# Patient Record
Sex: Male | Born: 1965 | Race: White | Hispanic: No | Marital: Single | Smoking: Former smoker
Health system: Southern US, Community
[De-identification: ages and names within clinical notes are randomized; demographics above are authoritative.]

## PROBLEM LIST (undated history)

## (undated) DIAGNOSIS — M1712 Unilateral primary osteoarthritis, left knee: Secondary | ICD-10-CM

## (undated) DIAGNOSIS — M1990 Unspecified osteoarthritis, unspecified site: Secondary | ICD-10-CM

## (undated) DIAGNOSIS — K589 Irritable bowel syndrome without diarrhea: Secondary | ICD-10-CM

## (undated) DIAGNOSIS — E785 Hyperlipidemia, unspecified: Secondary | ICD-10-CM

## (undated) DIAGNOSIS — K219 Gastro-esophageal reflux disease without esophagitis: Secondary | ICD-10-CM

## (undated) DIAGNOSIS — I10 Essential (primary) hypertension: Secondary | ICD-10-CM

## (undated) HISTORY — PX: KNEE ARTHROSCOPY: SHX127

---

## 2006-11-30 ENCOUNTER — Encounter (INDEPENDENT_AMBULATORY_CARE_PROVIDER_SITE_OTHER): Payer: Self-pay | Admitting: Family Medicine

## 2006-12-28 ENCOUNTER — Ambulatory Visit: Payer: Self-pay | Admitting: Family Medicine

## 2006-12-28 DIAGNOSIS — M129 Arthropathy, unspecified: Secondary | ICD-10-CM | POA: Insufficient documentation

## 2006-12-28 DIAGNOSIS — K589 Irritable bowel syndrome without diarrhea: Secondary | ICD-10-CM | POA: Insufficient documentation

## 2006-12-28 DIAGNOSIS — J309 Allergic rhinitis, unspecified: Secondary | ICD-10-CM | POA: Insufficient documentation

## 2006-12-28 DIAGNOSIS — K219 Gastro-esophageal reflux disease without esophagitis: Secondary | ICD-10-CM | POA: Insufficient documentation

## 2006-12-31 ENCOUNTER — Encounter (INDEPENDENT_AMBULATORY_CARE_PROVIDER_SITE_OTHER): Payer: Self-pay | Admitting: Family Medicine

## 2007-01-04 ENCOUNTER — Encounter (INDEPENDENT_AMBULATORY_CARE_PROVIDER_SITE_OTHER): Payer: Self-pay | Admitting: Family Medicine

## 2007-01-04 ENCOUNTER — Telehealth (INDEPENDENT_AMBULATORY_CARE_PROVIDER_SITE_OTHER): Payer: Self-pay | Admitting: Family Medicine

## 2007-01-05 ENCOUNTER — Ambulatory Visit: Payer: Self-pay | Admitting: Cardiovascular Disease

## 2007-01-06 ENCOUNTER — Ambulatory Visit: Payer: Self-pay | Admitting: Family Medicine

## 2007-01-08 ENCOUNTER — Ambulatory Visit (HOSPITAL_COMMUNITY): Admission: RE | Admit: 2007-01-08 | Discharge: 2007-01-08 | Payer: Self-pay | Admitting: Cardiovascular Disease

## 2007-01-11 LAB — CONVERTED CEMR LAB
ALT: 29 units/L (ref 0–53)
AST: 19 units/L (ref 0–37)
Alkaline Phosphatase: 78 units/L (ref 39–117)
Basophils Absolute: 0 10*3/uL (ref 0.0–0.1)
Basophils Relative: 0 % (ref 0–1)
CO2: 29 meq/L (ref 19–32)
Cholesterol: 132 mg/dL (ref 0–200)
Creatinine, Ser: 0.9 mg/dL (ref 0.40–1.50)
Eosinophils Relative: 3 % (ref 0–5)
HCT: 46 % (ref 39.0–52.0)
LDL Cholesterol: 62 mg/dL (ref 0–99)
Lymphocytes Relative: 21 % (ref 12–46)
MCHC: 32.8 g/dL (ref 30.0–36.0)
Monocytes Absolute: 0.7 10*3/uL (ref 0.2–0.7)
Platelets: 243 10*3/uL (ref 150–400)
RDW: 13.8 % (ref 11.5–14.0)
Sodium: 143 meq/L (ref 135–145)
TSH: 5.072 microintl units/mL (ref 0.350–5.50)
Total Bilirubin: 0.4 mg/dL (ref 0.3–1.2)
Total CHOL/HDL Ratio: 4.1
Total Protein: 7 g/dL (ref 6.0–8.3)
VLDL: 38 mg/dL (ref 0–40)

## 2007-01-12 ENCOUNTER — Encounter (HOSPITAL_COMMUNITY): Admission: RE | Admit: 2007-01-12 | Discharge: 2007-02-11 | Payer: Self-pay | Admitting: Cardiovascular Disease

## 2007-01-12 ENCOUNTER — Encounter (INDEPENDENT_AMBULATORY_CARE_PROVIDER_SITE_OTHER): Payer: Self-pay | Admitting: Family Medicine

## 2007-01-12 ENCOUNTER — Ambulatory Visit: Payer: Self-pay | Admitting: Cardiovascular Disease

## 2007-01-25 ENCOUNTER — Ambulatory Visit: Payer: Self-pay | Admitting: Family Medicine

## 2007-01-25 DIAGNOSIS — E785 Hyperlipidemia, unspecified: Secondary | ICD-10-CM | POA: Insufficient documentation

## 2007-01-25 DIAGNOSIS — I10 Essential (primary) hypertension: Secondary | ICD-10-CM | POA: Insufficient documentation

## 2007-01-25 LAB — CONVERTED CEMR LAB
Cholesterol, target level: 200 mg/dL
LDL Goal: 130 mg/dL

## 2007-03-31 ENCOUNTER — Ambulatory Visit: Payer: Self-pay | Admitting: Family Medicine

## 2007-11-11 ENCOUNTER — Ambulatory Visit: Payer: Self-pay | Admitting: Family Medicine

## 2008-01-11 ENCOUNTER — Ambulatory Visit: Payer: Self-pay | Admitting: Family Medicine

## 2008-02-10 ENCOUNTER — Telehealth (INDEPENDENT_AMBULATORY_CARE_PROVIDER_SITE_OTHER): Payer: Self-pay | Admitting: *Deleted

## 2008-04-24 ENCOUNTER — Encounter (INDEPENDENT_AMBULATORY_CARE_PROVIDER_SITE_OTHER): Payer: Self-pay | Admitting: Family Medicine

## 2008-06-12 ENCOUNTER — Ambulatory Visit: Payer: Self-pay | Admitting: Family Medicine

## 2008-07-31 ENCOUNTER — Ambulatory Visit: Payer: Self-pay | Admitting: Family Medicine

## 2008-11-14 ENCOUNTER — Encounter (INDEPENDENT_AMBULATORY_CARE_PROVIDER_SITE_OTHER): Payer: Self-pay | Admitting: Family Medicine

## 2008-12-27 ENCOUNTER — Ambulatory Visit: Payer: Self-pay | Admitting: Family Medicine

## 2008-12-28 ENCOUNTER — Telehealth (INDEPENDENT_AMBULATORY_CARE_PROVIDER_SITE_OTHER): Payer: Self-pay | Admitting: *Deleted

## 2008-12-29 ENCOUNTER — Ambulatory Visit: Payer: Self-pay | Admitting: Family Medicine

## 2009-01-03 ENCOUNTER — Ambulatory Visit: Payer: Self-pay | Admitting: Internal Medicine

## 2009-01-03 ENCOUNTER — Encounter (INDEPENDENT_AMBULATORY_CARE_PROVIDER_SITE_OTHER): Payer: Self-pay | Admitting: Family Medicine

## 2009-01-11 ENCOUNTER — Ambulatory Visit: Payer: Self-pay | Admitting: Family Medicine

## 2009-01-26 ENCOUNTER — Ambulatory Visit: Payer: Self-pay | Admitting: Family Medicine

## 2009-01-26 ENCOUNTER — Ambulatory Visit (HOSPITAL_COMMUNITY): Admission: RE | Admit: 2009-01-26 | Discharge: 2009-01-26 | Payer: Self-pay | Admitting: Family Medicine

## 2009-01-26 DIAGNOSIS — L255 Unspecified contact dermatitis due to plants, except food: Secondary | ICD-10-CM | POA: Insufficient documentation

## 2009-01-26 DIAGNOSIS — R51 Headache: Secondary | ICD-10-CM | POA: Insufficient documentation

## 2009-01-26 DIAGNOSIS — R519 Headache, unspecified: Secondary | ICD-10-CM | POA: Insufficient documentation

## 2009-02-02 ENCOUNTER — Ambulatory Visit: Payer: Self-pay | Admitting: Family Medicine

## 2009-03-19 ENCOUNTER — Ambulatory Visit: Payer: Self-pay | Admitting: Family Medicine

## 2009-03-27 ENCOUNTER — Encounter (INDEPENDENT_AMBULATORY_CARE_PROVIDER_SITE_OTHER): Payer: Self-pay | Admitting: *Deleted

## 2009-03-27 LAB — CONVERTED CEMR LAB
ALT: 30 units/L (ref 0–53)
Alkaline Phosphatase: 77 units/L (ref 39–117)
Basophils Absolute: 0.1 10*3/uL (ref 0.0–0.1)
Basophils Relative: 1 % (ref 0–1)
CO2: 20 meq/L (ref 19–32)
Cholesterol: 136 mg/dL (ref 0–200)
Creatinine, Ser: 0.79 mg/dL (ref 0.40–1.50)
Eosinophils Absolute: 0.2 10*3/uL (ref 0.0–0.7)
LDL Cholesterol: 70 mg/dL (ref 0–99)
MCHC: 35.5 g/dL (ref 30.0–36.0)
MCV: 84.6 fL (ref 78.0–100.0)
Monocytes Relative: 6 % (ref 3–12)
Neutro Abs: 7.5 10*3/uL (ref 1.7–7.7)
Neutrophils Relative %: 75 % (ref 43–77)
Platelets: 257 10*3/uL (ref 150–400)
RBC: 5.33 M/uL (ref 4.22–5.81)
RDW: 13.5 % (ref 11.5–15.5)
Sodium: 138 meq/L (ref 135–145)
Total Bilirubin: 0.5 mg/dL (ref 0.3–1.2)
Total CHOL/HDL Ratio: 4.1
Triglycerides: 166 mg/dL — ABNORMAL HIGH (ref <150)
VLDL: 33 mg/dL (ref 0–40)

## 2010-09-08 HISTORY — PX: MENISCECTOMY: SHX123

## 2010-09-27 ENCOUNTER — Ambulatory Visit
Admission: RE | Admit: 2010-09-27 | Discharge: 2010-09-27 | Payer: Self-pay | Source: Home / Self Care | Attending: Orthopedic Surgery | Admitting: Orthopedic Surgery

## 2010-09-30 LAB — BASIC METABOLIC PANEL
BUN: 11 mg/dL (ref 6–23)
Creatinine, Ser: 0.78 mg/dL (ref 0.4–1.5)
GFR calc non Af Amer: >60 mL/min (ref >60)
Glucose, Bld: 112 mg/dL — ABNORMAL HIGH (ref 70–99)

## 2010-09-30 LAB — POCT HEMOGLOBIN-HEMACUE: Hemoglobin: 14.5 g/dL (ref 13.0–17.0)

## 2010-10-03 NOTE — Op Note (Addendum)
  NAME:  Shawn Glover, Shawn Glover NO.:  [id number]  MEDICAL RECORD NO.:  [id number]          PATIENT TYPE:  AMB  LOCATION:  DSC                          FACILITY:  [facility]  PHYSICIAN:  Eulas Post, MD    DATE OF BIRTH:  03/07/1966  DATE OF PROCEDURE:  09/27/2010 DATE OF DISCHARGE:                              OPERATIVE REPORT   PREOPERATIVE DIAGNOSIS:  Left knee medial meniscus tear.  POSTOPERATIVE DIAGNOSES: 1. 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 2. Grade 2 medial femoral condyle chondromalacia.  OPERATIVE PROCEDURE:  Left knee arthroscopy with partial medial meniscectomy and patellofemoral chondroplasty and chondroplasty of the medial femoral condyle.  ATTENDING SURGEON:  Eulas Post, MD.  ASSISTANT:  Julien Girt, PA-C  OPERATIVE FINDINGS:  The patellofemoral joint had grade 2 chondral changes.  This was primarily on the femoral trochlea.  The medial femoral condyle also had fairly extensive grade 2 changes.  The lateral compartment was completely normal.  The ACL and PCL were intact.  The medial meniscus had a radial tear in the posterior horn that extended back to the capsule.  OPERATIVE PROCEDURE:  The patient was brought to the operating room, placed in supine position.  General anesthesia administered.  Left lower extremity was prepped and draped in usual sterile fashion.  Diagnostic arthroscopy was carried out with the above-named findings.  The arthroscopic shaver and arthroscopic biter was used to debride the medial meniscus back to a stable rim.  The shaver was used to debride the femoral trochlea as well as the medial femoral condyle.  The arthroscopy was completed and irrigated the knee copiously and removed all loose debris, and the instruments were removed and the knee was injected with Marcaine, and I closed the portals with Monocryl followed by Steri-Strips and sterile  gauze.  He was awakened and returned to the PACU in stable and satisfactory condition.  There were no complications. He tolerated the procedure well.  Kerrison Shepperson was present and scrubbed throughout the case and assisted with positioning and exposure, especially given the patient's morbid obesity.  There were no complications.  He tolerated the procedure well.     Eulas Post, MD    JPL/MEDQ  D:  09/27/2010  T:  09/27/2010  Job:  [id number]  Electronically Signed by Teryl Lucy MD on 10/03/2010 05:30:49 PM

## 2011-01-21 NOTE — Procedures (Signed)
NAME:  YOEL, KAUFHOLD NO.:  [id number]   MEDICAL RECORD NO.:  [id number]          PATIENT TYPE:  OUT   LOCATION:  RESP                          FACILITY:  [facility]   PHYSICIAN:  Edward L. Juanetta Gosling, M.D.DATE OF BIRTH:  Aug 16, 1966   DATE OF PROCEDURE:  DATE OF DISCHARGE:                            PULMONARY FUNCTION TEST   PULMONARY FUNCTION TEST:  1. Spirometry is essentially normal with, perhaps, some airflow      obstruction which is mild and most marked in the smaller airways.  2. Total lung capacity and lung volumes are normal.  3. DLCO is normal.  4. Arterial blood gases show mild resting relative hypoxemia.  5. There is significant bronchodilator effect.      Edward L. Juanetta Gosling, M.D.  Electronically Signed     ELH/MEDQ  D:  01/11/2007  T:  01/12/2007  Job:  [id number]   cc:   Noralyn Pick. Eden Emms, MD, [facility]  [address]  [address]  [place]  [place] [address]

## 2011-01-21 NOTE — Procedures (Signed)
NAME:  BRANDT, CHANEY NO.:  [id number]   MEDICAL RECORD NO.:  [id number]          PATIENT TYPE:  REC   LOCATION:  RAD                           FACILITY:  [facility]   PHYSICIAN:  Noralyn Pick. Eden Emms, MD, FACCDATE OF BIRTH:  1965-09-26   DATE OF PROCEDURE:  01/12/2007  DATE OF DISCHARGE:                                ECHOCARDIOGRAM   PROCEDURE:  A 2-D echocardiogram.   INDICATIONS:  Dyspnea.   Left ventricular cavity size was normal.  There was mild to moderate  LVH, ejection fraction 60%.  There are no regional wall motion  abnormalities and no outflow tract gradient.  Mitral valve was mildly  thickened without significant stenosis or regurgitation.  There was mild  left atrial enlargement.  Right-sided cardiac chambers were normal.  There was no evidence of pulmonary hypertension.  Aortic valve was  trileaflet and sclerotic.  There was no stenosis.  Aortic root was  normal.  Subcostal imaging revealed no atrial septal defect, no source  of embolus, no effusion.   M-MODE MEASUREMENTS:  Aortic dimension was 32 mm, left atrial dimension  was 41 mm, septal thickness was 15 mm, LV diastolic dimension 38 mm, LV  systolic dimension 26 mm.   FINAL IMPRESSION:  1. Mild to moderate LVH, ejection fraction 60%.  2. Mild left atrial enlargement.  3. Normal right-sided cardiac chambers with mild TR, no evidence of      pulmonary hypertension.  4. Aortic valve sclerosis.  5. Normal mitral valve.  6. No pericardial effusion.      Noralyn Pick. Eden Emms, MD, [facility]  Electronically Signed     PCN/MEDQ  D:  01/12/2007  T:  01/12/2007  Job:  [id number]

## 2011-01-24 NOTE — Assessment & Plan Note (Signed)
[facility]                       [facility] CARDIOLOGY OFFICE NOTE   CARRON, JAGGI                         MRN:          [id number]  DATE:01/05/2007                            DOB:          11/26/1965    Mr. Shawn Glover is a delightful 45 year old patient referred by Dr.  Erby Pian.  He has been having chest pain and dyspnea.   The patient has no previously documented coronary disease or heart  problems. He has never been tested.   He currently [place] [employer] [place]   He has been in security for a long time, however, he has been more  sedentary, since 2005 he has gained approximately 70 to 80  pounds of  weight. He is a previous smoker quitting in the late 90s. He carries no  diagnosis of asthma, COPD, or emphysema. He does have exertional dyspnea  and there is no associated diaphoresis. On occasion with his dyspnea he  gets a pressure sensation in his chest. There has been no fever or  sputum.  The chest discomfort is not pleuritic   This has been going on for the last 1 to 3 months.   The patient has not had any prolonged episodes. There has been no  associated diaphoresis, syncope, or palpitations.   He has never had a stress test.   The patient's dyspnea is not associated with wheezing. He is currently  not married but does say that people have noted that he snores loudly.   He has never had a formal sleep study.   REVIEW OF SYSTEMS:  Otherwise negative other than indicated in the HPI.   He apparently saw Dr. Erby Pian recently, prior to this he had been going  to urgent cares. He is currently not taking any medications but there is  a question of borderline hypertension. His cardiac risk factors include  this borderline hypertension. He is not a diabetic. He is currently not  smoking. There is no family history of coronary disease.   He has not had any previous surgery or hospitalizations. He has no known  allergies. Patient  is divorced. He has 2 children that live in  [place]. 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 We had a fairly long discussion regarding his eating habits  and lifestyle. He has gotten very sedentary.   FAMILY HISTORY:  Remarkable for mother still being alive and healthy,  dad dying of colon cancer at the age of 102, two brothers and 3 sisters  living and healthy.  Meds:  Non on regular basis   NKDA   His exam is remarkable for a blood pressure of 135/85, pulse is somewhat  elevated at 95 to 98, it is regular.  Thyroid is not palpable.  HEENT: Normal. There is no thyromegaly. There is no carotid bruits.  LUNGS: Clear.  There is an S1, S2 with normal heart sounds.  ABDOMEN: Benign.  LOWER EXTREMITIES:  Intact pulse, no edema.  NEURO: Nonfocal.  SKIN: Warm and dry.   EKG: Totally normal with somewhat elevated heart rate of 100.   IMPRESSION:  Exertional dyspnea in a 45 year old gentleman who has  gained excessive weight.   Clearly part of his exertional dyspnea is related to his weight gain and  deconditioning, however, I think he deserves more of a work up.   We will check a 2D echocardiogram to rule out pulmonary hypertension and  assess right ventricular and left ventricular function.   He did not have any significant murmurs and I doubt that he has  significant valvular disease. The patient will also have a follow up  stress Myoview to make sure the dyspnea is not an anginal equivalent  particularly since there is associated chest pressure.   He will also have some baseline PFTs.   We will see if Dr. Erby Pian has checked any lab work, it would be  reasonable given his tachycardia and dyspnea to possibly check a  hemoglobin, and hematocrit, as well as TSH, and T4. The patient needs to  see a  nutritionist and also needs to be more active. However, to clear  him for his activity and workout schedule, I think it is reasonable to  proceed with his stress test as indicated.     Noralyn Pick. Eden Emms, MD, [facility]  Electronically Signed    PCN/MedQ  DD: 01/05/2007  DT: 01/05/2007  Job #: [id number]

## 2015-12-10 DIAGNOSIS — R7301 Impaired fasting glucose: Secondary | ICD-10-CM | POA: Diagnosis not present

## 2015-12-10 DIAGNOSIS — E782 Mixed hyperlipidemia: Secondary | ICD-10-CM | POA: Diagnosis not present

## 2015-12-12 DIAGNOSIS — I10 Essential (primary) hypertension: Secondary | ICD-10-CM | POA: Diagnosis not present

## 2015-12-12 DIAGNOSIS — E782 Mixed hyperlipidemia: Secondary | ICD-10-CM | POA: Diagnosis not present

## 2016-06-13 DIAGNOSIS — R7301 Impaired fasting glucose: Secondary | ICD-10-CM | POA: Diagnosis not present

## 2016-06-13 DIAGNOSIS — E785 Hyperlipidemia, unspecified: Secondary | ICD-10-CM | POA: Diagnosis not present

## 2016-06-18 DIAGNOSIS — E785 Hyperlipidemia, unspecified: Secondary | ICD-10-CM | POA: Diagnosis not present

## 2016-06-18 DIAGNOSIS — I10 Essential (primary) hypertension: Secondary | ICD-10-CM | POA: Diagnosis not present

## 2016-06-18 DIAGNOSIS — E669 Obesity, unspecified: Secondary | ICD-10-CM | POA: Diagnosis not present

## 2016-06-18 DIAGNOSIS — M1712 Unilateral primary osteoarthritis, left knee: Secondary | ICD-10-CM | POA: Diagnosis not present

## 2016-06-18 DIAGNOSIS — R7301 Impaired fasting glucose: Secondary | ICD-10-CM | POA: Diagnosis not present

## 2016-07-02 ENCOUNTER — Other Ambulatory Visit: Payer: Self-pay | Admitting: Orthopedic Surgery

## 2016-07-16 ENCOUNTER — Encounter (HOSPITAL_BASED_OUTPATIENT_CLINIC_OR_DEPARTMENT_OTHER): Payer: Self-pay | Admitting: *Deleted

## 2016-07-17 ENCOUNTER — Encounter (HOSPITAL_BASED_OUTPATIENT_CLINIC_OR_DEPARTMENT_OTHER)
Admission: RE | Admit: 2016-07-17 | Discharge: 2016-07-17 | Disposition: A | Discharge status: Home / Self Care | Payer: Federal, State, Local not specified - PPO | Source: Ambulatory Visit | Attending: Orthopedic Surgery | Admitting: Orthopedic Surgery

## 2016-07-17 ENCOUNTER — Other Ambulatory Visit: Payer: Self-pay

## 2016-07-17 DIAGNOSIS — Z01818 Encounter for other preprocedural examination: Secondary | ICD-10-CM | POA: Diagnosis not present

## 2016-07-17 LAB — BASIC METABOLIC PANEL
Anion gap: 10 (ref 5–15)
BUN: 6 mg/dL (ref 6–20)
CHLORIDE: 102 mmol/L (ref 101–111)
CO2: 26 mmol/L (ref 22–32)
Calcium: 9.4 mg/dL (ref 8.9–10.3)
Creatinine, Ser: 0.76 mg/dL (ref 0.61–1.24)
GFR calc Af Amer: >60 mL/min (ref >60)
GFR calc non Af Amer: >60 mL/min (ref >60)
Glucose, Bld: 136 mg/dL — ABNORMAL HIGH (ref 65–99)
POTASSIUM: 3.9 mmol/L (ref 3.5–5.1)
SODIUM: 138 mmol/L (ref 135–145)

## 2016-07-17 LAB — SURGICAL PCR SCREEN
MRSA, PCR: NEGATIVE
Staphylococcus aureus: NEGATIVE

## 2016-07-24 ENCOUNTER — Ambulatory Visit (HOSPITAL_COMMUNITY): Payer: Federal, State, Local not specified - PPO

## 2016-07-24 ENCOUNTER — Encounter (HOSPITAL_BASED_OUTPATIENT_CLINIC_OR_DEPARTMENT_OTHER)
Admission: RE | Disposition: A | Discharge status: Home / Self Care | Payer: Self-pay | Source: Ambulatory Visit | Attending: Orthopedic Surgery

## 2016-07-24 ENCOUNTER — Ambulatory Visit (HOSPITAL_BASED_OUTPATIENT_CLINIC_OR_DEPARTMENT_OTHER): Payer: Federal, State, Local not specified - PPO | Admitting: Anesthesiology

## 2016-07-24 ENCOUNTER — Ambulatory Visit (HOSPITAL_BASED_OUTPATIENT_CLINIC_OR_DEPARTMENT_OTHER)
Admission: RE | Admit: 2016-07-24 | Discharge: 2016-07-25 | Disposition: A | Discharge status: Home / Self Care | Payer: Federal, State, Local not specified - PPO | Source: Ambulatory Visit | Attending: Orthopedic Surgery | Admitting: Orthopedic Surgery

## 2016-07-24 ENCOUNTER — Encounter (HOSPITAL_BASED_OUTPATIENT_CLINIC_OR_DEPARTMENT_OTHER): Payer: Self-pay

## 2016-07-24 DIAGNOSIS — Z96652 Presence of left artificial knee joint: Secondary | ICD-10-CM | POA: Diagnosis not present

## 2016-07-24 DIAGNOSIS — M1712 Unilateral primary osteoarthritis, left knee: Secondary | ICD-10-CM | POA: Diagnosis not present

## 2016-07-24 DIAGNOSIS — Z87891 Personal history of nicotine dependence: Secondary | ICD-10-CM | POA: Diagnosis not present

## 2016-07-24 DIAGNOSIS — K219 Gastro-esophageal reflux disease without esophagitis: Secondary | ICD-10-CM | POA: Diagnosis not present

## 2016-07-24 DIAGNOSIS — E785 Hyperlipidemia, unspecified: Secondary | ICD-10-CM | POA: Insufficient documentation

## 2016-07-24 DIAGNOSIS — T888 Other specified complications of surgical and medical care, not elsewhere classified: Secondary | ICD-10-CM | POA: Diagnosis not present

## 2016-07-24 DIAGNOSIS — K589 Irritable bowel syndrome without diarrhea: Secondary | ICD-10-CM | POA: Diagnosis not present

## 2016-07-24 DIAGNOSIS — I10 Essential (primary) hypertension: Secondary | ICD-10-CM | POA: Diagnosis not present

## 2016-07-24 DIAGNOSIS — Z471 Aftercare following joint replacement surgery: Secondary | ICD-10-CM | POA: Diagnosis not present

## 2016-07-24 DIAGNOSIS — G8918 Other acute postprocedural pain: Secondary | ICD-10-CM | POA: Diagnosis not present

## 2016-07-24 HISTORY — DX: Unilateral primary osteoarthritis, left knee: M17.12

## 2016-07-24 HISTORY — PX: PARTIAL KNEE ARTHROPLASTY: SHX2174

## 2016-07-24 HISTORY — DX: Gastro-esophageal reflux disease without esophagitis: K21.9

## 2016-07-24 HISTORY — DX: Essential (primary) hypertension: I10

## 2016-07-24 HISTORY — DX: Hyperlipidemia, unspecified: E78.5

## 2016-07-24 HISTORY — DX: Irritable bowel syndrome, unspecified: K58.9

## 2016-07-24 HISTORY — DX: Unspecified osteoarthritis, unspecified site: M19.90

## 2016-07-24 SURGERY — ARTHROPLASTY, KNEE, UNICOMPARTMENTAL
Anesthesia: General | Site: Knee | Laterality: Left

## 2016-07-24 MED ORDER — CEFAZOLIN SODIUM-DEXTROSE 2-4 GM/100ML-% IV SOLN
INTRAVENOUS | Status: AC
  Filled 2016-07-24: qty 100

## 2016-07-24 MED ORDER — AMLODIPINE BESYLATE 10 MG PO TABS: 10.0000 mg | ORAL_TABLET | Freq: Every day | ORAL | Status: DC

## 2016-07-24 MED ORDER — HYDROMORPHONE HCL 1 MG/ML IJ SOLN
0.2500 mg | INTRAMUSCULAR | Status: DC | PRN
  Administered 2016-07-24 (×2): 0.5 mg via INTRAVENOUS

## 2016-07-24 MED ORDER — METOPROLOL TARTRATE 5 MG/5ML IV SOLN
INTRAVENOUS | Status: DC | PRN
  Administered 2016-07-24: 2.5 mg via INTRAVENOUS

## 2016-07-24 MED ORDER — DOCUSATE SODIUM 100 MG PO CAPS: 100.0000 mg | ORAL_CAPSULE | Freq: Two times a day (BID) | ORAL | Status: DC

## 2016-07-24 MED ORDER — HYDROMORPHONE HCL 1 MG/ML IJ SOLN: 0.5000 mg | INTRAMUSCULAR | Status: DC | PRN

## 2016-07-24 MED ORDER — SODIUM CHLORIDE 0.9 % IV SOLN
INTRAVENOUS | Status: DC
  Administered 2016-07-24: 12:00:00 via INTRAVENOUS

## 2016-07-24 MED ORDER — FENTANYL CITRATE (PF) 100 MCG/2ML IJ SOLN
INTRAMUSCULAR | Status: AC
  Filled 2016-07-24: qty 2

## 2016-07-24 MED ORDER — SENNA 8.6 MG PO TABS: 1.0000 | ORAL_TABLET | Freq: Two times a day (BID) | ORAL | Status: DC

## 2016-07-24 MED ORDER — SCOPOLAMINE 1 MG/3DAYS TD PT72: 1.0000 | MEDICATED_PATCH | Freq: Once | TRANSDERMAL | Status: DC | PRN

## 2016-07-24 MED ORDER — CEFAZOLIN SODIUM-DEXTROSE 2-4 GM/100ML-% IV SOLN
2.0000 g | INTRAVENOUS | Status: AC
  Administered 2016-07-24: 2 g via INTRAVENOUS

## 2016-07-24 MED ORDER — LACTATED RINGERS IV SOLN
INTRAVENOUS | Status: DC
  Administered 2016-07-24 (×2): via INTRAVENOUS

## 2016-07-24 MED ORDER — METHOCARBAMOL 1000 MG/10ML IJ SOLN: 500.0000 mg | Freq: Four times a day (QID) | INTRAVENOUS | Status: DC | PRN

## 2016-07-24 MED ORDER — POLYETHYLENE GLYCOL 3350 17 G PO PACK: 17.0000 g | PACK | Freq: Every day | ORAL | Status: DC | PRN

## 2016-07-24 MED ORDER — MIDAZOLAM HCL 2 MG/2ML IJ SOLN
INTRAMUSCULAR | Status: AC
  Filled 2016-07-24: qty 2

## 2016-07-24 MED ORDER — ONDANSETRON HCL 4 MG/2ML IJ SOLN: 4.0000 mg | Freq: Four times a day (QID) | INTRAMUSCULAR | Status: DC | PRN

## 2016-07-24 MED ORDER — PROPOFOL 500 MG/50ML IV EMUL
INTRAVENOUS | Status: AC
  Filled 2016-07-24: qty 50

## 2016-07-24 MED ORDER — HYDROMORPHONE HCL 1 MG/ML IJ SOLN
INTRAMUSCULAR | Status: AC
  Filled 2016-07-24: qty 1

## 2016-07-24 MED ORDER — BUPIVACAINE HCL (PF) 0.25 % IJ SOLN
INTRAMUSCULAR | Status: DC | PRN
  Administered 2016-07-24: 30 mL via INTRA_ARTICULAR

## 2016-07-24 MED ORDER — BISACODYL 10 MG RE SUPP: 10.0000 mg | Freq: Every day | RECTAL | Status: DC | PRN

## 2016-07-24 MED ORDER — OXYCODONE HCL 5 MG PO TABS: 5.0000 mg | ORAL_TABLET | ORAL | Status: DC | PRN

## 2016-07-24 MED ORDER — ONDANSETRON HCL 4 MG PO TABS: 4.0000 mg | ORAL_TABLET | Freq: Three times a day (TID) | ORAL | 0 refills | Status: DC | PRN

## 2016-07-24 MED ORDER — METHOCARBAMOL 500 MG PO TABS
500.0000 mg | ORAL_TABLET | Freq: Four times a day (QID) | ORAL | Status: DC | PRN
  Administered 2016-07-24 – 2016-07-25 (×2): 500 mg via ORAL
  Filled 2016-07-24 (×2): qty 1

## 2016-07-24 MED ORDER — CEFAZOLIN IN D5W 1 GM/50ML IV SOLN
1.0000 g | Freq: Four times a day (QID) | INTRAVENOUS | Status: AC
  Administered 2016-07-24 – 2016-07-25 (×3): 1 g via INTRAVENOUS
  Filled 2016-07-24 (×3): qty 50

## 2016-07-24 MED ORDER — MIDAZOLAM HCL 2 MG/2ML IJ SOLN
1.0000 mg | INTRAMUSCULAR | Status: DC | PRN
  Administered 2016-07-24: 2 mg via INTRAVENOUS

## 2016-07-24 MED ORDER — KETOROLAC TROMETHAMINE 30 MG/ML IJ SOLN
INTRAMUSCULAR | Status: AC
  Filled 2016-07-24: qty 1

## 2016-07-24 MED ORDER — ZOLPIDEM TARTRATE 5 MG PO TABS: 5.0000 mg | ORAL_TABLET | Freq: Every evening | ORAL | Status: DC | PRN

## 2016-07-24 MED ORDER — BUPIVACAINE-EPINEPHRINE (PF) 0.5% -1:200000 IJ SOLN
INTRAMUSCULAR | Status: DC | PRN
  Administered 2016-07-24: 30 mL via PERINEURAL

## 2016-07-24 MED ORDER — BACLOFEN 10 MG PO TABS: 10.0000 mg | ORAL_TABLET | Freq: Three times a day (TID) | ORAL | 0 refills | Status: DC

## 2016-07-24 MED ORDER — DEXAMETHASONE SODIUM PHOSPHATE 4 MG/ML IJ SOLN
INTRAMUSCULAR | Status: DC | PRN
  Administered 2016-07-24: 10 mg via INTRAVENOUS

## 2016-07-24 MED ORDER — LACTATED RINGERS IV SOLN
INTRAVENOUS | Status: DC
Start: 2016-07-24 — End: 2016-07-25

## 2016-07-24 MED ORDER — SODIUM CHLORIDE 0.9 % IV SOLN
INTRAVENOUS | Status: DC | PRN
  Administered 2016-07-24: 1000 mL

## 2016-07-24 MED ORDER — LIDOCAINE 2% (20 MG/ML) 5 ML SYRINGE
INTRAMUSCULAR | Status: AC
  Filled 2016-07-24: qty 5

## 2016-07-24 MED ORDER — ONDANSETRON HCL 4 MG/2ML IJ SOLN
INTRAMUSCULAR | Status: AC
  Filled 2016-07-24: qty 2

## 2016-07-24 MED ORDER — METOCLOPRAMIDE HCL 5 MG PO TABS: 5.0000 mg | ORAL_TABLET | Freq: Three times a day (TID) | ORAL | Status: DC | PRN

## 2016-07-24 MED ORDER — FENTANYL CITRATE (PF) 100 MCG/2ML IJ SOLN
INTRAMUSCULAR | Status: DC | PRN
  Administered 2016-07-24 (×2): 25 ug via INTRAVENOUS
  Administered 2016-07-24: 50 ug via INTRAVENOUS
  Administered 2016-07-24: 100 ug via INTRAVENOUS
  Administered 2016-07-24: 25 ug via INTRAVENOUS
  Administered 2016-07-24: 50 ug via INTRAVENOUS
  Administered 2016-07-24 (×2): 25 ug via INTRAVENOUS

## 2016-07-24 MED ORDER — ONDANSETRON HCL 4 MG PO TABS: 4.0000 mg | ORAL_TABLET | Freq: Four times a day (QID) | ORAL | Status: DC | PRN

## 2016-07-24 MED ORDER — ONDANSETRON HCL 4 MG/2ML IJ SOLN
INTRAMUSCULAR | Status: DC | PRN
  Administered 2016-07-24: 4 mg via INTRAVENOUS

## 2016-07-24 MED ORDER — FENTANYL CITRATE (PF) 100 MCG/2ML IJ SOLN
50.0000 ug | INTRAMUSCULAR | Status: DC | PRN
  Administered 2016-07-24: 100 ug via INTRAVENOUS

## 2016-07-24 MED ORDER — OXYCODONE HCL 5 MG PO TABS: 5.0000 mg | ORAL_TABLET | ORAL | 0 refills | Status: DC | PRN

## 2016-07-24 MED ORDER — SENNA-DOCUSATE SODIUM 8.6-50 MG PO TABS: 2.0000 | ORAL_TABLET | Freq: Every day | ORAL | 1 refills | Status: DC

## 2016-07-24 MED ORDER — METOCLOPRAMIDE HCL 5 MG/ML IJ SOLN
10.0000 mg | Freq: Once | INTRAMUSCULAR | Status: DC | PRN
Start: 2016-07-24 — End: 2016-07-25

## 2016-07-24 MED ORDER — OXYCODONE-ACETAMINOPHEN 5-325 MG PO TABS
1.0000 | ORAL_TABLET | ORAL | Status: DC | PRN
  Administered 2016-07-24: 2 via ORAL
  Administered 2016-07-24: 1 via ORAL
  Administered 2016-07-25: 2 via ORAL
  Filled 2016-07-24: qty 2
  Filled 2016-07-24: qty 1
  Filled 2016-07-24: qty 2

## 2016-07-24 MED ORDER — KETOROLAC TROMETHAMINE 30 MG/ML IJ SOLN
INTRAMUSCULAR | Status: DC | PRN
  Administered 2016-07-24: 30 mg via INTRA_ARTICULAR

## 2016-07-24 MED ORDER — METOCLOPRAMIDE HCL 5 MG/ML IJ SOLN: 5.0000 mg | Freq: Three times a day (TID) | INTRAMUSCULAR | Status: DC | PRN

## 2016-07-24 MED ORDER — MEPERIDINE HCL 25 MG/ML IJ SOLN
6.2500 mg | INTRAMUSCULAR | Status: DC | PRN
Start: 2016-07-24 — End: 2016-07-25

## 2016-07-24 MED ORDER — RIVAROXABAN 10 MG PO TABS: 10.0000 mg | ORAL_TABLET | Freq: Every day | ORAL | 0 refills | Status: DC

## 2016-07-24 MED ORDER — MIDAZOLAM HCL 5 MG/5ML IJ SOLN
INTRAMUSCULAR | Status: DC | PRN
  Administered 2016-07-24: 2 mg via INTRAVENOUS

## 2016-07-24 MED ORDER — LIDOCAINE HCL (CARDIAC) 20 MG/ML IV SOLN
INTRAVENOUS | Status: DC | PRN
  Administered 2016-07-24: 50 mg via INTRAVENOUS

## 2016-07-24 MED ORDER — METOPROLOL TARTRATE 5 MG/5ML IV SOLN
INTRAVENOUS | Status: AC
  Filled 2016-07-24: qty 5

## 2016-07-24 MED ORDER — MAGNESIUM CITRATE PO SOLN: 1.0000 | Freq: Once | ORAL | Status: DC | PRN

## 2016-07-24 MED ORDER — PROPOFOL 10 MG/ML IV BOLUS
INTRAVENOUS | Status: DC | PRN
  Administered 2016-07-24: 50 mg via INTRAVENOUS
  Administered 2016-07-24: 200 mg via INTRAVENOUS

## 2016-07-24 SURGICAL SUPPLY — 67 items
BANDAGE ACE 6X5 VEL STRL LF (GAUZE/BANDAGES/DRESSINGS) ×2 IMPLANT
BANDAGE ESMARK 6X9 LF (GAUZE/BANDAGES/DRESSINGS) ×1 IMPLANT
BLADE SURG 10 STRL SS (BLADE) ×2 IMPLANT
BLADE SURG 15 STRL LF DISP TIS (BLADE) ×2 IMPLANT
BLADE SURG 15 STRL SS (BLADE) ×4
BNDG CMPR 9X6 STRL LF SNTH (GAUZE/BANDAGES/DRESSINGS) ×1
BNDG ESMARK 6X9 LF (GAUZE/BANDAGES/DRESSINGS) ×2
BOWL SMART MIX CTS (DISPOSABLE) ×2 IMPLANT
CANISTER SUCT 1200ML W/VALVE (MISCELLANEOUS) IMPLANT
CANISTER SUCTION 2500CC (MISCELLANEOUS) ×1 IMPLANT
CAPT KNEE PARTIAL 2 ×1 IMPLANT
CEMENT HV SMART SET (Cement) ×2 IMPLANT
CLSR STERI-STRIP ANTIMIC 1/2X4 (GAUZE/BANDAGES/DRESSINGS) ×3 IMPLANT
COVER BACK TABLE 60X90IN (DRAPES) ×2 IMPLANT
CUFF TOURNIQUET SINGLE 34IN LL (TOURNIQUET CUFF) IMPLANT
CUFF TOURNIQUET SINGLE 44IN (TOURNIQUET CUFF) ×1 IMPLANT
DECANTER SPIKE VIAL GLASS SM (MISCELLANEOUS) IMPLANT
DRAPE EXTREMITY T 121X128X90 (DRAPE) ×2 IMPLANT
DRAPE IMP U-DRAPE 54X76 (DRAPES) ×2 IMPLANT
DRAPE U-SHAPE 47X51 STRL (DRAPES) ×2 IMPLANT
DRSG MEPILEX BORDER 4X8 (GAUZE/BANDAGES/DRESSINGS) ×1 IMPLANT
DRSG PAD ABDOMINAL 8X10 ST (GAUZE/BANDAGES/DRESSINGS) ×1 IMPLANT
DURAPREP 26ML APPLICATOR (WOUND CARE) ×3 IMPLANT
ELECT REM PT RETURN 9FT ADLT (ELECTROSURGICAL) ×2
ELECTRODE REM PT RTRN 9FT ADLT (ELECTROSURGICAL) ×1 IMPLANT
FACESHIELD WRAPAROUND (MASK) ×4 IMPLANT
FACESHIELD WRAPAROUND OR TEAM (MASK) ×2 IMPLANT
GAUZE SPONGE 4X4 12PLY STRL (GAUZE/BANDAGES/DRESSINGS) ×1 IMPLANT
GLOVE BIO SURGEON STRL SZ8 (GLOVE) ×2 IMPLANT
GLOVE BIOGEL PI IND STRL 7.0 (GLOVE) IMPLANT
GLOVE BIOGEL PI IND STRL 8 (GLOVE) ×2 IMPLANT
GLOVE BIOGEL PI INDICATOR 7.0 (GLOVE) ×2
GLOVE BIOGEL PI INDICATOR 8 (GLOVE) ×2
GLOVE ECLIPSE 6.5 STRL STRAW (GLOVE) ×1 IMPLANT
GLOVE ORTHO TXT STRL SZ7.5 (GLOVE) ×2 IMPLANT
GOWN STRL REUS W/ TWL LRG LVL3 (GOWN DISPOSABLE) ×1 IMPLANT
GOWN STRL REUS W/ TWL XL LVL3 (GOWN DISPOSABLE) ×2 IMPLANT
GOWN STRL REUS W/TWL LRG LVL3 (GOWN DISPOSABLE) ×2
GOWN STRL REUS W/TWL XL LVL3 (GOWN DISPOSABLE) ×4
HANDPIECE INTERPULSE COAX TIP (DISPOSABLE) ×2
IMMOBILIZER KNEE 22 UNIV (SOFTGOODS) ×2 IMPLANT
IMMOBILIZER KNEE 24 THIGH 36 (MISCELLANEOUS) ×1 IMPLANT
IMMOBILIZER KNEE 24 UNIV (MISCELLANEOUS)
MANIFOLD NEPTUNE II (INSTRUMENTS) ×2 IMPLANT
NS IRRIG 1000ML POUR BTL (IV SOLUTION) ×2 IMPLANT
PACK ARTHROSCOPY DSU (CUSTOM PROCEDURE TRAY) ×2 IMPLANT
PACK BASIN DAY SURGERY FS (CUSTOM PROCEDURE TRAY) ×2 IMPLANT
PACK BLADE SAW RECIP 70 3 PT (BLADE) ×2 IMPLANT
PENCIL BUTTON HOLSTER BLD 10FT (ELECTRODE) ×2 IMPLANT
SET HNDPC FAN SPRY TIP SCT (DISPOSABLE) ×1 IMPLANT
SHEET MEDIUM DRAPE 40X70 STRL (DRAPES) ×2 IMPLANT
SLEEVE SCD COMPRESS KNEE MED (MISCELLANEOUS) ×2 IMPLANT
SPONGE LAP 18X18 X RAY DECT (DISPOSABLE) ×2 IMPLANT
SUCTION FRAZIER HANDLE 10FR (MISCELLANEOUS) ×1
SUCTION TUBE FRAZIER 10FR DISP (MISCELLANEOUS) ×1 IMPLANT
SUT MNCRL AB 4-0 PS2 18 (SUTURE) IMPLANT
SUT VIC AB 0 CT1 27 (SUTURE) ×2
SUT VIC AB 0 CT1 27XBRD ANBCTR (SUTURE) ×1 IMPLANT
SUT VIC AB 2-0 SH 27 (SUTURE) ×2
SUT VIC AB 2-0 SH 27XBRD (SUTURE) ×1 IMPLANT
SUT VICRYL 3-0 CR8 SH (SUTURE) ×2 IMPLANT
SUT VICRYL 4-0 PS2 18IN ABS (SUTURE) IMPLANT
SYR BULB IRRIGATION 50ML (SYRINGE) ×2 IMPLANT
TOWEL OR 17X24 6PK STRL BLUE (TOWEL DISPOSABLE) ×2 IMPLANT
TOWEL OR NON WOVEN STRL DISP B (DISPOSABLE) ×4 IMPLANT
TUBE CONNECTING 20X1/4 (TUBING) IMPLANT
YANKAUER SUCT BULB TIP NO VENT (SUCTIONS) ×2 IMPLANT

## 2016-07-24 NOTE — Anesthesia Preprocedure Evaluation (Signed)
Anesthesia Evaluation  Patient identified by MRN, date of birth, ID band Patient awake    Reviewed: Allergy & Precautions, NPO status , Patient's Chart, lab work & pertinent test results  Airway Mallampati: II  TM Distance: >3 FB Neck ROM: Full    Dental no notable dental hx.    Pulmonary neg pulmonary ROS, former smoker,    Pulmonary exam normal breath sounds clear to auscultation       Cardiovascular hypertension, Pt. on medications Normal cardiovascular exam Rhythm:Regular Rate:Normal     Neuro/Psych negative neurological ROS  negative psych ROS   GI/Hepatic negative GI ROS, Neg liver ROS,   Endo/Other  negative endocrine ROS  Renal/GU negative Renal ROS  negative genitourinary   Musculoskeletal negative musculoskeletal ROS (+)   Abdominal   Peds negative pediatric ROS (+)  Hematology negative hematology ROS (+)   Anesthesia Other Findings   Reproductive/Obstetrics negative OB ROS                             Anesthesia Physical Anesthesia Plan  ASA: II  Anesthesia Plan: General   Post-op Pain Management: GA combined w/ Regional for post-op pain   Induction: Intravenous  Airway Management Planned: LMA and Oral ETT  Additional Equipment:   Intra-op Plan:   Post-operative Plan: Extubation in OR  Informed Consent: I have reviewed the patients History and Physical, chart, labs and discussed the procedure including the risks, benefits and alternatives for the proposed anesthesia with the patient or authorized representative who has indicated his/her understanding and acceptance.   Dental advisory given  Plan Discussed with: CRNA  Anesthesia Plan Comments: (Adductor canal block)        Anesthesia Quick Evaluation

## 2016-07-24 NOTE — Op Note (Signed)
07/24/2016  9:40 AM  PATIENT:  Shawn Glover    PRE-OPERATIVE DIAGNOSIS:  Left Knee DJD  POST-OPERATIVE DIAGNOSIS:  Same  PROCEDURE:  LEFT UNICOMPARTMENTAL KNEE  SURGEON:  Eulas PostLANDAU,Melysa Schroyer P, MD  PHYSICIAN ASSISTANT: Janace LittenBrandon Parry, OPA-C, present and scrubbed throughout the case, critical for completion in a timely fashion, and for retraction, instrumentation, and closure.  ANESTHESIA:   General, with abductor canal block, and intra-articular injection of 30 mL of Marcaine and 30 mg of Toradol  Estimated blood loss: 75 mL  PREOPERATIVE INDICATIONS:  Shawn Glover is a  50 y.o. male with a diagnosis of Left Knee DJD who failed conservative measures and elected for surgical management.    The risks benefits and alternatives were discussed with the patient preoperatively including but not limited to the risks of infection, bleeding, nerve injury, cardiopulmonary complications, blood clots, the need for revision surgery, among others, and the patient was willing to proceed.  OPERATIVE IMPLANTS: Biomet Oxford mobile bearing medial compartment arthroplasty femur size large, tibia size D, bearing size 3.  OPERATIVE FINDINGS: Endstage grade 4 medial compartment osteoarthritis with significant hypertrophic osteophyte formation on the patella as well as along the medial gutter and along the suprapatellar ridge. The femoral trochlea had a small flap tear, with at least a grade 2 changes centrally. The lateral compartment was overall intact, the anterior cruciate ligament and PCL were intact. The medial compartment had scalloping of the tibia.  Unique aspects of the case: Access was very tight, I recut the tibia with a neutral shim after cutting it with the +2 shim. This barely gave enough space for a size 3. The MCL had significant contracture, and substantial varus chronic malalignment. The knee was somewhat tighter in flexion than extension, but ultimately I was able to balance the gaps. The  intramedullary guide did not have a lot of resistance, the ankle looked appropriate, but it slid in without much of a feel, as if there was a capacious intramedullary femoral canal.  OPERATIVE PROCEDURE: The patient was brought to the operating room placed in supine position. General anesthesia was administered. IV antibiotics were given. The lower extremity was placed in the legholder and prepped and draped in usual sterile fashion.  Time out was performed.  The leg was elevated and exsanguinated and the tourniquet was inflated. Anteromedial incision was performed, and I took care to preserve the MCL. Parapatellar incision was carried out, and the osteophytes were excised, along with the medial meniscus and a small portion of the fat pad.  The extra medullary tibial cutting jig was applied, using the spoon and the 4mm G-Clamp and the 2 mm shim, and I took care to protect the anterior cruciate ligament insertion and the tibial spine. The medial collateral ligament was also protected, and I resected my proximal tibia, matching the anatomic slope.   The proximal tibial bony cut was removed in one piece, and  measured my gap, which did not measure 4 mm, so I went back and took a second cut using the neutral shim. I then had a 4 mm gap, andI turned my attention to the femur.  The intramedullary femoral rod was placed using the drill, and then using the appropriate reference, I assembled the femoral jig, setting my posterior cutting block. I resected my posterior femur, used the 0 spigot for the anterior femur, and then measured my gap.   I then used the appropriate mill to match the extension gap to the flexion gap. The second  milling was at a 3.  The gaps were then measured again with the appropriate feeler gauges. Once I had balanced flexion and extension gaps, I then completed the preparation of the femur.  I milled off the anterior aspect of the distal femur to prevent impingement. I also exposed the  tibia, and selected the above-named component, and then used the cutting jig to prepare the keel slot on the tibia. I also used the awl to curette out the bone to complete the preparation of the keel. The back wall was intact.  I then placed trial components, and it was found to have excellent motion, and appropriate balance.  I then cemented the components into place, cementing the tibia first, removing all excess cement, and then cementing the femur.  All loose cement was removed.  The real polyethylene insert was applied manually, and the knee was taken through functional range of motion, and found to have excellent stability and restoration of joint motion, with excellent balance.  The wounds were irrigated copiously, and the parapatellar tissue closed with Vicryl, followed by Vicryl for the subcutaneous tissue, with routine closure with Steri-Strips and sterile gauze.  The tourniquet was released, and the patient was awakened and extubated and returned to PACU in stable and satisfactory condition. There were no complications.

## 2016-07-24 NOTE — Anesthesia Procedure Notes (Addendum)
Anesthesia Regional Block:  Adductor canal block  Pre-Anesthetic Checklist: ,, timeout performed, Correct Patient, Correct Site, Correct Laterality, Correct Procedure, Correct Position, site marked, Risks and benefits discussed,  Surgical consent,  Pre-op evaluation,  At surgeon's request and post-op pain management  Laterality: Left and Lower  Prep: Maximum Sterile Barrier Precautions used, chloraprep       Needles:  Injection technique: Single-shot  Needle Type: Echogenic Stimulator Needle     Needle Length: 10cm 10 cm Needle Gauge: 21 G    Additional Needles:  Procedures: ultrasound guided (picture in chart) Adductor canal block Narrative:  Injection made incrementally with aspirations every 5 mL.  Performed by: Personally   Additional Notes: Risks, benefits and alternative to block explained extensively.  Patient tolerated procedure well, without complications.

## 2016-07-24 NOTE — H&P (Signed)
PREOPERATIVE H&P  Chief Complaint: Left Knee DJD  HPI: Shawn Glover is a 50 y.o. male who presents for preoperative history and physical with a diagnosis of Left Knee DJD. Symptoms are rated as moderate to severe, and have been worsening.  This is significantly impairing activities of daily living.  He has elected for surgical management.   He has failed injections, activity modification, anti-inflammatories, previous knee arthroscopy, and assistive devices.  Preoperative X-rays demonstrate end stage degenerative changes with osteophyte formation, loss of joint space, subchondral sclerosis.   Past Medical History:  Diagnosis Date  . Arthritis   . GERD (gastroesophageal reflux disease)   . Hyperlipemia   . Hypertension   . IBS (irritable bowel syndrome)    Past Surgical History:  Procedure Laterality Date  . KNEE ARTHROSCOPY    . MENISCECTOMY  2012   Social History   Social History  . Marital status: Single    Spouse name: N/A  . Number of children: N/A  . Years of education: N/A   Social History Main Topics  . Smoking status: Former Games developermoker  . Smokeless tobacco: Never Used  . Alcohol use No  . Drug use: No  . Sexual activity: Not Asked   Other Topics Concern  . None   Social History Narrative  . None   History reviewed. No pertinent family history. Not on File Prior to Admission medications   Medication Sig Start Date End Date Taking? Authorizing Provider  amLODipine (NORVASC) 10 MG tablet Take 10 mg by mouth daily.   Yes Historical Provider, MD     Positive ROS: All other systems have been reviewed and were otherwise negative with the exception of those mentioned in the HPI and as above.  Physical Exam: General: Alert, no acute distress Cardiovascular: No pedal edema Respiratory: No cyanosis, no use of accessory musculature GI: No organomegaly, abdomen is soft and non-tender Skin: No lesions in the area of chief complaint Neurologic: Sensation intact  distally Psychiatric: Patient is competent for consent with normal mood and affect Lymphatic: No axillary or cervical lymphadenopathy  MUSCULOSKELETAL: left knee with varus pseudolaxity and crepitance with painful arc of motion 0-125  Assessment: Left Knee DJD   Plan: Plan for Procedure(s): LEFT UNICOMPARTMENTAL KNEE  The risks benefits and alternatives were discussed with the patient including but not limited to the risks of nonoperative treatment, versus surgical intervention including infection, bleeding, nerve injury,  blood clots, cardiopulmonary complications, morbidity, mortality, among others, and they were willing to proceed.   Eulas PostLANDAU,Andrei Mccook P, MD Cell [phone]   07/24/2016 7:34 AM

## 2016-07-24 NOTE — Transfer of Care (Signed)
Immediate Anesthesia Transfer of Care Note  Patient: Shawn LoftyJohn A Rhett  Procedure(s) Performed: Procedure(s): LEFT UNICOMPARTMENTAL KNEE (Left)  Patient Location: PACU  Anesthesia Type:GA combined with regional for post-op pain  Level of Consciousness: sedated  Airway & Oxygen Therapy: Patient Spontanous Breathing and Patient connected to face mask oxygen  Post-op Assessment: Report given to RN and Post -op Vital signs reviewed and stable  Post vital signs: Reviewed and stable  Last Vitals:  Vitals:   07/24/16 0710 07/24/16 0715  BP:  126/71  Pulse: 68 78  Resp: 12 17  Temp:      Last Pain:  Vitals:   07/24/16 0631  TempSrc: Oral         Complications: No apparent anesthesia complications

## 2016-07-24 NOTE — Anesthesia Procedure Notes (Signed)
Procedure Name: LMA Insertion Date/Time: 07/24/2016 7:42 AM Performed by: Genevieve NorlanderLINKA, Shawn Glover Pre-anesthesia Checklist: Patient identified, Emergency Drugs available, Suction available, Patient being monitored and Timeout performed Patient Re-evaluated:Patient Re-evaluated prior to inductionOxygen Delivery Method: Circle system utilized Preoxygenation: Pre-oxygenation with 100% oxygen Intubation Type: IV induction Ventilation: Mask ventilation without difficulty LMA: LMA inserted LMA Size: 5.0 Number of attempts: 1 Airway Equipment and Method: Bite block Placement Confirmation: positive ETCO2 Tube secured with: Tape Dental Injury: Teeth and Oropharynx as per pre-operative assessment

## 2016-07-24 NOTE — Progress Notes (Signed)
Assisted Dr. Carignan with left, ultrasound guided, femoral block. Side rails up, monitors on throughout procedure. See vital signs in flow sheet. Tolerated Procedure well. 

## 2016-07-24 NOTE — Anesthesia Postprocedure Evaluation (Signed)
Anesthesia Post Note  Patient: Shawn Glover  Procedure(s) Performed: Procedure(s) (LRB): LEFT UNICOMPARTMENTAL KNEE (Left)  Patient location during evaluation: PACU Anesthesia Type: General and Regional Level of consciousness: awake and alert Pain management: pain level controlled Vital Signs Assessment: post-procedure vital signs reviewed and stable Respiratory status: spontaneous breathing, nonlabored ventilation, respiratory function stable and patient connected to nasal cannula oxygen Cardiovascular status: blood pressure returned to baseline and stable Postop Assessment: no signs of nausea or vomiting Anesthetic complications: no    Last Vitals:  Vitals:   07/24/16 1115 07/24/16 1130  BP: 123/80 117/87  Pulse: 88 75  Resp: 16 16  Temp:      Last Pain:  Vitals:   07/24/16 1145  TempSrc:   PainSc: 1                  Phillips Groutarignan, Carlisle Enke

## 2016-07-24 NOTE — Discharge Instructions (Signed)
INSTRUCTIONS AFTER JOINT REPLACEMENT  ° °o Remove items at home which could result in a fall. This includes throw rugs or furniture in walking pathways °o ICE to the affected joint every three hours while awake for 30 minutes at a time, for at least the first 3-5 days, and then as needed for pain and swelling.  Continue to use ice for pain and swelling. You may notice swelling that will progress down to the foot and ankle.  This is normal after surgery.  Elevate your leg when you are not up walking on it.   °o Continue to use the breathing machine you got in the hospital (incentive spirometer) which will help keep your temperature down.  It is common for your temperature to cycle up and down following surgery, especially at night when you are not up moving around and exerting yourself.  The breathing machine keeps your lungs expanded and your temperature down. ° ° °DIET:  As you were doing prior to hospitalization, we recommend a well-balanced diet. ° °DRESSING / WOUND CARE / SHOWERING ° °You may change your dressing 3-5 days after surgery.  Then change the dressing every day with sterile gauze.  Please use good hand washing techniques before changing the dressing.  Do not use any lotions or creams on the incision until instructed by your surgeon. ° °ACTIVITY ° °o Increase activity slowly as tolerated, but follow the weight bearing instructions below.   °o No driving for 6 weeks or until further direction given by your physician.  You cannot drive while taking narcotics.  °o No lifting or carrying greater than 10 lbs. until further directed by your surgeon. °o Avoid periods of inactivity such as sitting longer than an hour when not asleep. This helps prevent blood clots.  °o You may return to work once you are authorized by your doctor.  ° ° ° °WEIGHT BEARING  ° °Weight bearing as tolerated with assist device (walker, cane, etc) as directed, use it as long as suggested by your surgeon or therapist, typically at  least 4-6 weeks. ° ° °EXERCISES ° °Results after joint replacement surgery are often greatly improved when you follow the exercise, range of motion and muscle strengthening exercises prescribed by your doctor. Safety measures are also important to protect the joint from further injury. Any time any of these exercises cause you to have increased pain or swelling, decrease what you are doing until you are comfortable again and then slowly increase them. If you have problems or questions, call your caregiver or physical therapist for advice.  ° °Rehabilitation is important following a joint replacement. After just a few days of immobilization, the muscles of the leg can become weakened and shrink (atrophy).  These exercises are designed to build up the tone and strength of the thigh and leg muscles and to improve motion. Often times heat used for twenty to thirty minutes before working out will loosen up your tissues and help with improving the range of motion but do not use heat for the first two weeks following surgery (sometimes heat can increase post-operative swelling).  ° °These exercises can be done on a training (exercise) mat, on the floor, on a table or on a bed. Use whatever works the best and is most comfortable for you.    Use music or television while you are exercising so that the exercises are a pleasant break in your day. This will make your life better with the exercises acting as a break   in your routine that you can look forward to.   Perform all exercises about fifteen times, three times per day or as directed.  You should exercise both the operative leg and the other leg as well. ° °Exercises include: °  °• Quad Sets - Tighten up the muscle on the front of the thigh (Quad) and hold for 5-10 seconds.   °• Straight Leg Raises - With your knee straight (if you were given a brace, keep it on), lift the leg to 60 degrees, hold for 3 seconds, and slowly lower the leg.  Perform this exercise against  resistance later as your leg gets stronger.  °• Leg Slides: Lying on your back, slowly slide your foot toward your buttocks, bending your knee up off the floor (only go as far as is comfortable). Then slowly slide your foot back down until your leg is flat on the floor again.  °• Angel Wings: Lying on your back spread your legs to the side as far apart as you can without causing discomfort.  °• Hamstring Strength:  Lying on your back, push your heel against the floor with your leg straight by tightening up the muscles of your buttocks.  Repeat, but this time bend your knee to a comfortable angle, and push your heel against the floor.  You may put a pillow under the heel to make it more comfortable if necessary.  ° °A rehabilitation program following joint replacement surgery can speed recovery and prevent re-injury in the future due to weakened muscles. Contact your doctor or a physical therapist for more information on knee rehabilitation.  ° ° °CONSTIPATION ° °Constipation is defined medically as fewer than three stools per week and severe constipation as less than one stool per week.  Even if you have a regular bowel pattern at home, your normal regimen is likely to be disrupted due to multiple reasons following surgery.  Combination of anesthesia, postoperative narcotics, change in appetite and fluid intake all can affect your bowels.  ° °YOU MUST use at least one of the following options; they are listed in order of increasing strength to get the job done.  They are all available over the counter, and you may need to use some, POSSIBLY even all of these options:   ° °Drink plenty of fluids (prune juice may be helpful) and high fiber foods °Colace 100 mg by mouth twice a day  °Senokot for constipation as directed and as needed Dulcolax (bisacodyl), take with full glass of water  °Miralax (polyethylene glycol) once or twice a day as needed. ° °If you have tried all these things and are unable to have a bowel  movement in the first 3-4 days after surgery call either your surgeon or your primary doctor.   ° °If you experience loose stools or diarrhea, hold the medications until you stool forms back up.  If your symptoms do not get better within 1 week or if they get worse, check with your doctor.  If you experience "the worst abdominal pain ever" or develop nausea or vomiting, please contact the office immediately for further recommendations for treatment. ° ° °ITCHING:  If you experience itching with your medications, try taking only a single pain pill, or even half a pain pill at a time.  You can also use Benadryl over the counter for itching or also to help with sleep.  ° °TED HOSE STOCKINGS:  Use stockings on both legs until for at least 2 weeks or as   directed by physician office. They may be removed at night for sleeping.  MEDICATIONS:  See your medication summary on the After Visit Summary that nursing will review with you.  You may have some home medications which will be placed on hold until you complete the course of blood thinner medication.  It is important for you to complete the blood thinner medication as prescribed.  PRECAUTIONS:  If you experience chest pain or shortness of breath - call 911 immediately for transfer to the hospital emergency department.   If you develop a fever greater that 101 F, purulent drainage from wound, increased redness or drainage from wound, foul odor from the wound/dressing, or calf pain - CONTACT YOUR SURGEON.                                                   FOLLOW-UP APPOINTMENTS:  If you do not already have a post-op appointment, please call the office for an appointment to be seen by your surgeon.  Guidelines for how soon to be seen are listed in your After Visit Summary, but are typically between 1-4 weeks after surgery.   MAKE SURE YOU:   Understand these instructions.   Get help right away if you are not doing well or get worse.    Thank you for  letting us be a part of your medical care team.  It is a privilege we respect greatly.  We hope these instructions will help you stay on track for a fast and full recovery!   Regional Anesthesia Blocks  1. Numbness or the inability to move the "blocked" extremity may last from 3-48 hours after placement. The length of time depends on the medication injected and your individual response to the medication. If the numbness is not going away after 48 hours, call your surgeon.  2. The extremity that is blocked will need to be protected until the numbness is gone and the  Strength has returned. Because you cannot feel it, you will need to take extra care to avoid injury. Because it may be weak, you may have difficulty moving it or using it. You may not know what position it is in without looking at it while the block is in effect.  3. For blocks in the legs and feet, returning to weight bearing and walking needs to be done carefully. You will need to wait until the numbness is entirely gone and the strength has returned. You should be able to move your leg and foot normally before you try and bear weight or walk. You will need someone to be with you when you first try to ensure you do not fall and possibly risk injury.  4. Bruising and tenderness at the needle site are common side effects and will resolve in a few days.  5. Persistent numbness or new problems with movement should be communicated to the surgeon or the [facility] [phone])/ [facility] [phone]).

## 2016-07-25 ENCOUNTER — Encounter (HOSPITAL_BASED_OUTPATIENT_CLINIC_OR_DEPARTMENT_OTHER): Payer: Self-pay | Admitting: Orthopedic Surgery

## 2016-07-25 DIAGNOSIS — K589 Irritable bowel syndrome without diarrhea: Secondary | ICD-10-CM | POA: Diagnosis not present

## 2016-07-25 DIAGNOSIS — I10 Essential (primary) hypertension: Secondary | ICD-10-CM | POA: Diagnosis not present

## 2016-07-25 DIAGNOSIS — K219 Gastro-esophageal reflux disease without esophagitis: Secondary | ICD-10-CM | POA: Diagnosis not present

## 2016-07-25 DIAGNOSIS — E785 Hyperlipidemia, unspecified: Secondary | ICD-10-CM | POA: Diagnosis not present

## 2016-07-25 DIAGNOSIS — M1712 Unilateral primary osteoarthritis, left knee: Secondary | ICD-10-CM | POA: Diagnosis not present

## 2016-07-25 DIAGNOSIS — Z87891 Personal history of nicotine dependence: Secondary | ICD-10-CM | POA: Diagnosis not present

## 2016-07-30 DIAGNOSIS — M25562 Pain in left knee: Secondary | ICD-10-CM | POA: Diagnosis not present

## 2016-07-30 DIAGNOSIS — M1712 Unilateral primary osteoarthritis, left knee: Secondary | ICD-10-CM | POA: Diagnosis not present

## 2016-07-30 DIAGNOSIS — M25662 Stiffness of left knee, not elsewhere classified: Secondary | ICD-10-CM | POA: Diagnosis not present

## 2016-07-30 DIAGNOSIS — Z96652 Presence of left artificial knee joint: Secondary | ICD-10-CM | POA: Diagnosis not present

## 2016-08-04 DIAGNOSIS — M1712 Unilateral primary osteoarthritis, left knee: Secondary | ICD-10-CM | POA: Diagnosis not present

## 2016-08-04 DIAGNOSIS — M25662 Stiffness of left knee, not elsewhere classified: Secondary | ICD-10-CM | POA: Diagnosis not present

## 2016-08-04 DIAGNOSIS — Z96652 Presence of left artificial knee joint: Secondary | ICD-10-CM | POA: Diagnosis not present

## 2016-08-04 DIAGNOSIS — M25562 Pain in left knee: Secondary | ICD-10-CM | POA: Diagnosis not present

## 2016-08-06 DIAGNOSIS — M1712 Unilateral primary osteoarthritis, left knee: Secondary | ICD-10-CM | POA: Diagnosis not present

## 2016-08-06 DIAGNOSIS — Z96652 Presence of left artificial knee joint: Secondary | ICD-10-CM | POA: Diagnosis not present

## 2016-08-07 DIAGNOSIS — M25662 Stiffness of left knee, not elsewhere classified: Secondary | ICD-10-CM | POA: Diagnosis not present

## 2016-08-07 DIAGNOSIS — Z96652 Presence of left artificial knee joint: Secondary | ICD-10-CM | POA: Diagnosis not present

## 2016-08-07 DIAGNOSIS — M25562 Pain in left knee: Secondary | ICD-10-CM | POA: Diagnosis not present

## 2016-08-07 DIAGNOSIS — M1712 Unilateral primary osteoarthritis, left knee: Secondary | ICD-10-CM | POA: Diagnosis not present

## 2016-08-11 DIAGNOSIS — Z96652 Presence of left artificial knee joint: Secondary | ICD-10-CM | POA: Diagnosis not present

## 2016-08-11 DIAGNOSIS — M25662 Stiffness of left knee, not elsewhere classified: Secondary | ICD-10-CM | POA: Diagnosis not present

## 2016-08-11 DIAGNOSIS — M1712 Unilateral primary osteoarthritis, left knee: Secondary | ICD-10-CM | POA: Diagnosis not present

## 2016-08-11 DIAGNOSIS — M25562 Pain in left knee: Secondary | ICD-10-CM | POA: Diagnosis not present

## 2016-08-13 DIAGNOSIS — M1712 Unilateral primary osteoarthritis, left knee: Secondary | ICD-10-CM | POA: Diagnosis not present

## 2016-08-13 DIAGNOSIS — Z96652 Presence of left artificial knee joint: Secondary | ICD-10-CM | POA: Diagnosis not present

## 2016-08-13 DIAGNOSIS — M25662 Stiffness of left knee, not elsewhere classified: Secondary | ICD-10-CM | POA: Diagnosis not present

## 2016-08-13 DIAGNOSIS — M25562 Pain in left knee: Secondary | ICD-10-CM | POA: Diagnosis not present

## 2016-08-18 DIAGNOSIS — M25662 Stiffness of left knee, not elsewhere classified: Secondary | ICD-10-CM | POA: Diagnosis not present

## 2016-08-18 DIAGNOSIS — Z96652 Presence of left artificial knee joint: Secondary | ICD-10-CM | POA: Diagnosis not present

## 2016-08-18 DIAGNOSIS — M1712 Unilateral primary osteoarthritis, left knee: Secondary | ICD-10-CM | POA: Diagnosis not present

## 2016-08-18 DIAGNOSIS — M25562 Pain in left knee: Secondary | ICD-10-CM | POA: Diagnosis not present

## 2016-08-21 DIAGNOSIS — Z96652 Presence of left artificial knee joint: Secondary | ICD-10-CM | POA: Diagnosis not present

## 2016-08-21 DIAGNOSIS — M1712 Unilateral primary osteoarthritis, left knee: Secondary | ICD-10-CM | POA: Diagnosis not present

## 2016-08-21 DIAGNOSIS — M25562 Pain in left knee: Secondary | ICD-10-CM | POA: Diagnosis not present

## 2016-08-21 DIAGNOSIS — M25662 Stiffness of left knee, not elsewhere classified: Secondary | ICD-10-CM | POA: Diagnosis not present

## 2016-08-26 DIAGNOSIS — M25662 Stiffness of left knee, not elsewhere classified: Secondary | ICD-10-CM | POA: Diagnosis not present

## 2016-08-26 DIAGNOSIS — Z96652 Presence of left artificial knee joint: Secondary | ICD-10-CM | POA: Diagnosis not present

## 2016-08-26 DIAGNOSIS — M1712 Unilateral primary osteoarthritis, left knee: Secondary | ICD-10-CM | POA: Diagnosis not present

## 2016-08-26 DIAGNOSIS — M25562 Pain in left knee: Secondary | ICD-10-CM | POA: Diagnosis not present

## 2016-08-28 DIAGNOSIS — M1712 Unilateral primary osteoarthritis, left knee: Secondary | ICD-10-CM | POA: Diagnosis not present

## 2016-08-28 DIAGNOSIS — M25562 Pain in left knee: Secondary | ICD-10-CM | POA: Diagnosis not present

## 2016-08-28 DIAGNOSIS — Z96652 Presence of left artificial knee joint: Secondary | ICD-10-CM | POA: Diagnosis not present

## 2016-08-28 DIAGNOSIS — M25662 Stiffness of left knee, not elsewhere classified: Secondary | ICD-10-CM | POA: Diagnosis not present

## 2016-09-02 DIAGNOSIS — M25662 Stiffness of left knee, not elsewhere classified: Secondary | ICD-10-CM | POA: Diagnosis not present

## 2016-09-02 DIAGNOSIS — M25562 Pain in left knee: Secondary | ICD-10-CM | POA: Diagnosis not present

## 2016-09-02 DIAGNOSIS — Z96652 Presence of left artificial knee joint: Secondary | ICD-10-CM | POA: Diagnosis not present

## 2016-09-02 DIAGNOSIS — M1712 Unilateral primary osteoarthritis, left knee: Secondary | ICD-10-CM | POA: Diagnosis not present

## 2016-09-03 DIAGNOSIS — Z96652 Presence of left artificial knee joint: Secondary | ICD-10-CM | POA: Diagnosis not present

## 2016-09-03 DIAGNOSIS — M1712 Unilateral primary osteoarthritis, left knee: Secondary | ICD-10-CM | POA: Diagnosis not present

## 2016-09-05 DIAGNOSIS — M25562 Pain in left knee: Secondary | ICD-10-CM | POA: Diagnosis not present

## 2016-09-05 DIAGNOSIS — M1712 Unilateral primary osteoarthritis, left knee: Secondary | ICD-10-CM | POA: Diagnosis not present

## 2016-09-05 DIAGNOSIS — M25662 Stiffness of left knee, not elsewhere classified: Secondary | ICD-10-CM | POA: Diagnosis not present

## 2016-09-05 DIAGNOSIS — Z96652 Presence of left artificial knee joint: Secondary | ICD-10-CM | POA: Diagnosis not present

## 2016-09-09 DIAGNOSIS — Z96652 Presence of left artificial knee joint: Secondary | ICD-10-CM | POA: Diagnosis not present

## 2016-09-09 DIAGNOSIS — M25562 Pain in left knee: Secondary | ICD-10-CM | POA: Diagnosis not present

## 2016-09-09 DIAGNOSIS — M25662 Stiffness of left knee, not elsewhere classified: Secondary | ICD-10-CM | POA: Diagnosis not present

## 2016-09-09 DIAGNOSIS — M1712 Unilateral primary osteoarthritis, left knee: Secondary | ICD-10-CM | POA: Diagnosis not present

## 2016-09-12 DIAGNOSIS — M25662 Stiffness of left knee, not elsewhere classified: Secondary | ICD-10-CM | POA: Diagnosis not present

## 2016-09-12 DIAGNOSIS — Z96652 Presence of left artificial knee joint: Secondary | ICD-10-CM | POA: Diagnosis not present

## 2016-09-12 DIAGNOSIS — M25562 Pain in left knee: Secondary | ICD-10-CM | POA: Diagnosis not present

## 2016-09-12 DIAGNOSIS — M1712 Unilateral primary osteoarthritis, left knee: Secondary | ICD-10-CM | POA: Diagnosis not present

## 2016-09-16 DIAGNOSIS — Z96652 Presence of left artificial knee joint: Secondary | ICD-10-CM | POA: Diagnosis not present

## 2016-09-16 DIAGNOSIS — M25662 Stiffness of left knee, not elsewhere classified: Secondary | ICD-10-CM | POA: Diagnosis not present

## 2016-09-16 DIAGNOSIS — M25562 Pain in left knee: Secondary | ICD-10-CM | POA: Diagnosis not present

## 2016-09-16 DIAGNOSIS — M1712 Unilateral primary osteoarthritis, left knee: Secondary | ICD-10-CM | POA: Diagnosis not present

## 2016-09-19 DIAGNOSIS — M25562 Pain in left knee: Secondary | ICD-10-CM | POA: Diagnosis not present

## 2016-09-19 DIAGNOSIS — M1712 Unilateral primary osteoarthritis, left knee: Secondary | ICD-10-CM | POA: Diagnosis not present

## 2016-09-19 DIAGNOSIS — Z96652 Presence of left artificial knee joint: Secondary | ICD-10-CM | POA: Diagnosis not present

## 2016-09-19 DIAGNOSIS — M25662 Stiffness of left knee, not elsewhere classified: Secondary | ICD-10-CM | POA: Diagnosis not present

## 2016-09-23 DIAGNOSIS — M25562 Pain in left knee: Secondary | ICD-10-CM | POA: Diagnosis not present

## 2016-09-23 DIAGNOSIS — M1712 Unilateral primary osteoarthritis, left knee: Secondary | ICD-10-CM | POA: Diagnosis not present

## 2016-09-23 DIAGNOSIS — M25662 Stiffness of left knee, not elsewhere classified: Secondary | ICD-10-CM | POA: Diagnosis not present

## 2016-09-23 DIAGNOSIS — Z96652 Presence of left artificial knee joint: Secondary | ICD-10-CM | POA: Diagnosis not present

## 2016-09-30 DIAGNOSIS — M25662 Stiffness of left knee, not elsewhere classified: Secondary | ICD-10-CM | POA: Diagnosis not present

## 2016-09-30 DIAGNOSIS — M25562 Pain in left knee: Secondary | ICD-10-CM | POA: Diagnosis not present

## 2016-09-30 DIAGNOSIS — M1712 Unilateral primary osteoarthritis, left knee: Secondary | ICD-10-CM | POA: Diagnosis not present

## 2016-09-30 DIAGNOSIS — Z96652 Presence of left artificial knee joint: Secondary | ICD-10-CM | POA: Diagnosis not present

## 2016-10-01 DIAGNOSIS — Z96652 Presence of left artificial knee joint: Secondary | ICD-10-CM | POA: Diagnosis not present

## 2016-10-03 DIAGNOSIS — Z96652 Presence of left artificial knee joint: Secondary | ICD-10-CM | POA: Diagnosis not present

## 2016-10-03 DIAGNOSIS — M25562 Pain in left knee: Secondary | ICD-10-CM | POA: Diagnosis not present

## 2016-10-03 DIAGNOSIS — M25662 Stiffness of left knee, not elsewhere classified: Secondary | ICD-10-CM | POA: Diagnosis not present

## 2016-10-03 DIAGNOSIS — M1712 Unilateral primary osteoarthritis, left knee: Secondary | ICD-10-CM | POA: Diagnosis not present

## 2016-10-07 DIAGNOSIS — M25562 Pain in left knee: Secondary | ICD-10-CM | POA: Diagnosis not present

## 2016-10-07 DIAGNOSIS — Z96652 Presence of left artificial knee joint: Secondary | ICD-10-CM | POA: Diagnosis not present

## 2016-10-07 DIAGNOSIS — M1712 Unilateral primary osteoarthritis, left knee: Secondary | ICD-10-CM | POA: Diagnosis not present

## 2016-10-07 DIAGNOSIS — M25662 Stiffness of left knee, not elsewhere classified: Secondary | ICD-10-CM | POA: Diagnosis not present

## 2016-10-10 DIAGNOSIS — M25662 Stiffness of left knee, not elsewhere classified: Secondary | ICD-10-CM | POA: Diagnosis not present

## 2016-10-10 DIAGNOSIS — M1712 Unilateral primary osteoarthritis, left knee: Secondary | ICD-10-CM | POA: Diagnosis not present

## 2016-10-10 DIAGNOSIS — M25562 Pain in left knee: Secondary | ICD-10-CM | POA: Diagnosis not present

## 2016-10-10 DIAGNOSIS — Z96652 Presence of left artificial knee joint: Secondary | ICD-10-CM | POA: Diagnosis not present

## 2016-10-14 DIAGNOSIS — M25662 Stiffness of left knee, not elsewhere classified: Secondary | ICD-10-CM | POA: Diagnosis not present

## 2016-10-14 DIAGNOSIS — M25562 Pain in left knee: Secondary | ICD-10-CM | POA: Diagnosis not present

## 2016-10-14 DIAGNOSIS — Z96652 Presence of left artificial knee joint: Secondary | ICD-10-CM | POA: Diagnosis not present

## 2016-10-14 DIAGNOSIS — M1712 Unilateral primary osteoarthritis, left knee: Secondary | ICD-10-CM | POA: Diagnosis not present

## 2016-10-17 DIAGNOSIS — Z96652 Presence of left artificial knee joint: Secondary | ICD-10-CM | POA: Diagnosis not present

## 2016-10-17 DIAGNOSIS — M25662 Stiffness of left knee, not elsewhere classified: Secondary | ICD-10-CM | POA: Diagnosis not present

## 2016-10-17 DIAGNOSIS — M25562 Pain in left knee: Secondary | ICD-10-CM | POA: Diagnosis not present

## 2016-10-17 DIAGNOSIS — M1712 Unilateral primary osteoarthritis, left knee: Secondary | ICD-10-CM | POA: Diagnosis not present

## 2016-10-20 DIAGNOSIS — M25562 Pain in left knee: Secondary | ICD-10-CM | POA: Diagnosis not present

## 2016-10-20 DIAGNOSIS — Z96652 Presence of left artificial knee joint: Secondary | ICD-10-CM | POA: Diagnosis not present

## 2016-10-20 DIAGNOSIS — M1712 Unilateral primary osteoarthritis, left knee: Secondary | ICD-10-CM | POA: Diagnosis not present

## 2016-10-20 DIAGNOSIS — M25662 Stiffness of left knee, not elsewhere classified: Secondary | ICD-10-CM | POA: Diagnosis not present

## 2016-10-24 DIAGNOSIS — M25562 Pain in left knee: Secondary | ICD-10-CM | POA: Diagnosis not present

## 2016-10-24 DIAGNOSIS — M25662 Stiffness of left knee, not elsewhere classified: Secondary | ICD-10-CM | POA: Diagnosis not present

## 2016-10-24 DIAGNOSIS — M1712 Unilateral primary osteoarthritis, left knee: Secondary | ICD-10-CM | POA: Diagnosis not present

## 2016-10-24 DIAGNOSIS — Z96652 Presence of left artificial knee joint: Secondary | ICD-10-CM | POA: Diagnosis not present

## 2016-10-28 DIAGNOSIS — M25562 Pain in left knee: Secondary | ICD-10-CM | POA: Diagnosis not present

## 2016-10-28 DIAGNOSIS — Z96652 Presence of left artificial knee joint: Secondary | ICD-10-CM | POA: Diagnosis not present

## 2016-10-28 DIAGNOSIS — M25662 Stiffness of left knee, not elsewhere classified: Secondary | ICD-10-CM | POA: Diagnosis not present

## 2016-10-28 DIAGNOSIS — M1712 Unilateral primary osteoarthritis, left knee: Secondary | ICD-10-CM | POA: Diagnosis not present

## 2016-10-29 DIAGNOSIS — M1712 Unilateral primary osteoarthritis, left knee: Secondary | ICD-10-CM | POA: Diagnosis not present

## 2016-10-31 DIAGNOSIS — M1712 Unilateral primary osteoarthritis, left knee: Secondary | ICD-10-CM | POA: Diagnosis not present

## 2016-10-31 DIAGNOSIS — M25662 Stiffness of left knee, not elsewhere classified: Secondary | ICD-10-CM | POA: Diagnosis not present

## 2016-10-31 DIAGNOSIS — M25562 Pain in left knee: Secondary | ICD-10-CM | POA: Diagnosis not present

## 2016-10-31 DIAGNOSIS — Z96652 Presence of left artificial knee joint: Secondary | ICD-10-CM | POA: Diagnosis not present

## 2016-11-04 DIAGNOSIS — M1712 Unilateral primary osteoarthritis, left knee: Secondary | ICD-10-CM | POA: Diagnosis not present

## 2016-11-04 DIAGNOSIS — M25562 Pain in left knee: Secondary | ICD-10-CM | POA: Diagnosis not present

## 2016-11-04 DIAGNOSIS — M25662 Stiffness of left knee, not elsewhere classified: Secondary | ICD-10-CM | POA: Diagnosis not present

## 2016-11-04 DIAGNOSIS — Z96652 Presence of left artificial knee joint: Secondary | ICD-10-CM | POA: Diagnosis not present

## 2016-11-07 DIAGNOSIS — Z96652 Presence of left artificial knee joint: Secondary | ICD-10-CM | POA: Diagnosis not present

## 2016-11-07 DIAGNOSIS — M25662 Stiffness of left knee, not elsewhere classified: Secondary | ICD-10-CM | POA: Diagnosis not present

## 2016-11-07 DIAGNOSIS — M1712 Unilateral primary osteoarthritis, left knee: Secondary | ICD-10-CM | POA: Diagnosis not present

## 2016-11-07 DIAGNOSIS — M25562 Pain in left knee: Secondary | ICD-10-CM | POA: Diagnosis not present

## 2016-11-11 DIAGNOSIS — M25562 Pain in left knee: Secondary | ICD-10-CM | POA: Diagnosis not present

## 2016-11-11 DIAGNOSIS — M1712 Unilateral primary osteoarthritis, left knee: Secondary | ICD-10-CM | POA: Diagnosis not present

## 2016-11-11 DIAGNOSIS — Z96652 Presence of left artificial knee joint: Secondary | ICD-10-CM | POA: Diagnosis not present

## 2016-11-11 DIAGNOSIS — M25662 Stiffness of left knee, not elsewhere classified: Secondary | ICD-10-CM | POA: Diagnosis not present

## 2016-11-21 DIAGNOSIS — M25562 Pain in left knee: Secondary | ICD-10-CM | POA: Diagnosis not present

## 2016-11-21 DIAGNOSIS — M1712 Unilateral primary osteoarthritis, left knee: Secondary | ICD-10-CM | POA: Diagnosis not present

## 2016-11-21 DIAGNOSIS — M25662 Stiffness of left knee, not elsewhere classified: Secondary | ICD-10-CM | POA: Diagnosis not present

## 2016-11-21 DIAGNOSIS — Z96652 Presence of left artificial knee joint: Secondary | ICD-10-CM | POA: Diagnosis not present

## 2016-11-28 DIAGNOSIS — M25562 Pain in left knee: Secondary | ICD-10-CM | POA: Diagnosis not present

## 2016-11-28 DIAGNOSIS — Z96652 Presence of left artificial knee joint: Secondary | ICD-10-CM | POA: Diagnosis not present

## 2016-11-28 DIAGNOSIS — M1712 Unilateral primary osteoarthritis, left knee: Secondary | ICD-10-CM | POA: Diagnosis not present

## 2016-11-28 DIAGNOSIS — M25662 Stiffness of left knee, not elsewhere classified: Secondary | ICD-10-CM | POA: Diagnosis not present

## 2016-12-01 DIAGNOSIS — Z96652 Presence of left artificial knee joint: Secondary | ICD-10-CM | POA: Diagnosis not present

## 2016-12-15 DIAGNOSIS — R7301 Impaired fasting glucose: Secondary | ICD-10-CM | POA: Diagnosis not present

## 2016-12-15 DIAGNOSIS — I10 Essential (primary) hypertension: Secondary | ICD-10-CM | POA: Diagnosis not present

## 2016-12-15 DIAGNOSIS — E785 Hyperlipidemia, unspecified: Secondary | ICD-10-CM | POA: Diagnosis not present

## 2016-12-17 DIAGNOSIS — E785 Hyperlipidemia, unspecified: Secondary | ICD-10-CM | POA: Diagnosis not present

## 2016-12-17 DIAGNOSIS — R7301 Impaired fasting glucose: Secondary | ICD-10-CM | POA: Diagnosis not present

## 2016-12-17 DIAGNOSIS — I10 Essential (primary) hypertension: Secondary | ICD-10-CM | POA: Diagnosis not present

## 2016-12-17 DIAGNOSIS — Z0001 Encounter for general adult medical examination with abnormal findings: Secondary | ICD-10-CM | POA: Diagnosis not present

## 2017-01-28 DIAGNOSIS — Z96652 Presence of left artificial knee joint: Secondary | ICD-10-CM | POA: Diagnosis not present

## 2017-05-21 ENCOUNTER — Other Ambulatory Visit: Payer: Self-pay | Admitting: Internal Medicine

## 2017-05-21 ENCOUNTER — Ambulatory Visit (HOSPITAL_COMMUNITY)
Admission: RE | Admit: 2017-05-21 | Discharge: 2017-05-21 | Disposition: A | Discharge status: Home / Self Care | Payer: Federal, State, Local not specified - PPO | Source: Ambulatory Visit | Attending: Internal Medicine | Admitting: Internal Medicine

## 2017-05-21 DIAGNOSIS — M25572 Pain in left ankle and joints of left foot: Secondary | ICD-10-CM | POA: Diagnosis not present

## 2017-05-21 DIAGNOSIS — S99912A Unspecified injury of left ankle, initial encounter: Secondary | ICD-10-CM | POA: Diagnosis not present

## 2017-05-21 DIAGNOSIS — M7989 Other specified soft tissue disorders: Secondary | ICD-10-CM | POA: Diagnosis not present

## 2017-05-21 DIAGNOSIS — M79672 Pain in left foot: Secondary | ICD-10-CM | POA: Diagnosis not present

## 2017-07-27 DIAGNOSIS — Z96652 Presence of left artificial knee joint: Secondary | ICD-10-CM | POA: Diagnosis not present

## 2017-12-22 IMAGING — CR DG ANKLE COMPLETE 3+V*L*
3 series · 3 of 3 positions shown · non-contrast
Comparison: None.

CLINICAL DATA: Left ankle pain and swelling after injury last
night.

EXAM:
LEFT ANKLE COMPLETE - 3+ VIEW

[x ankle ap left]
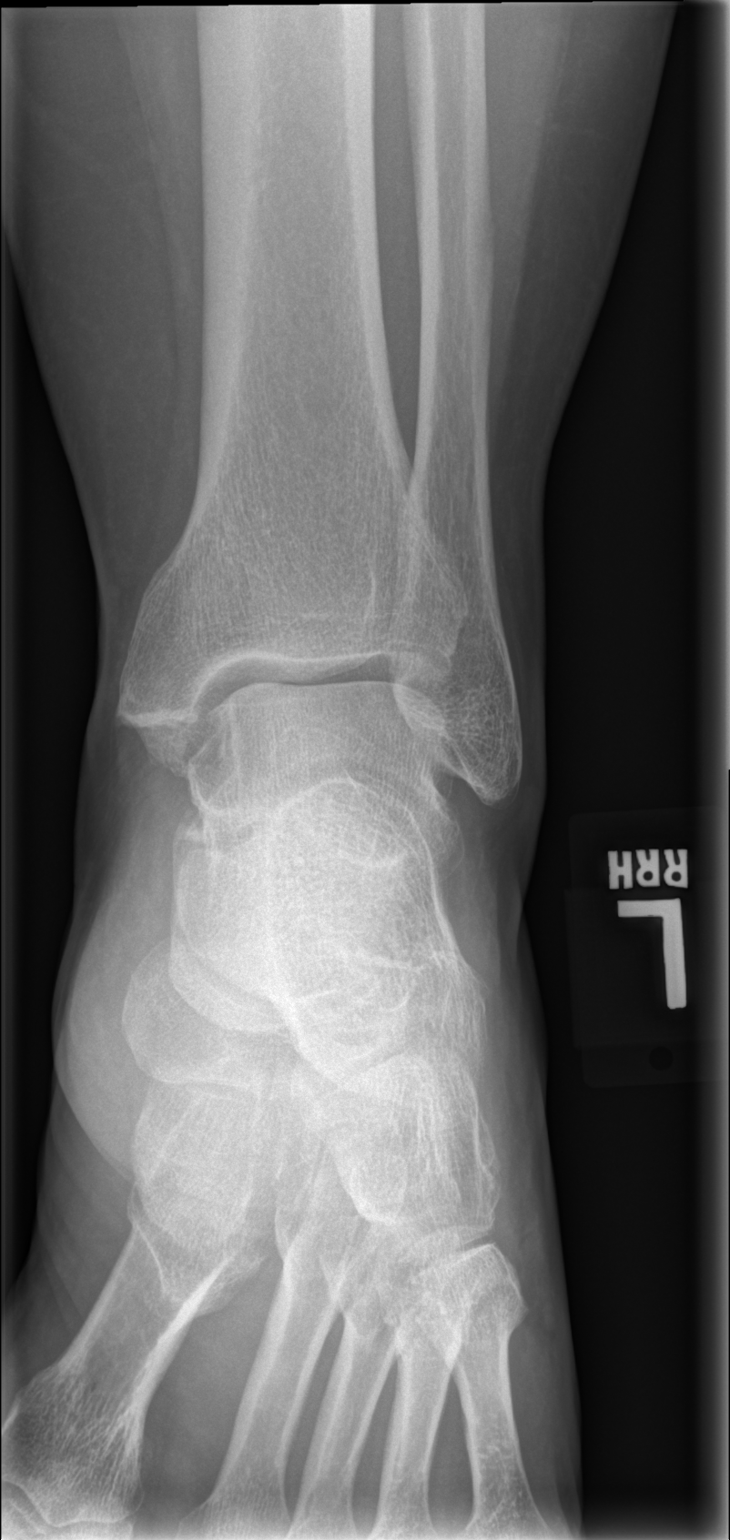

[x ankle obl left]
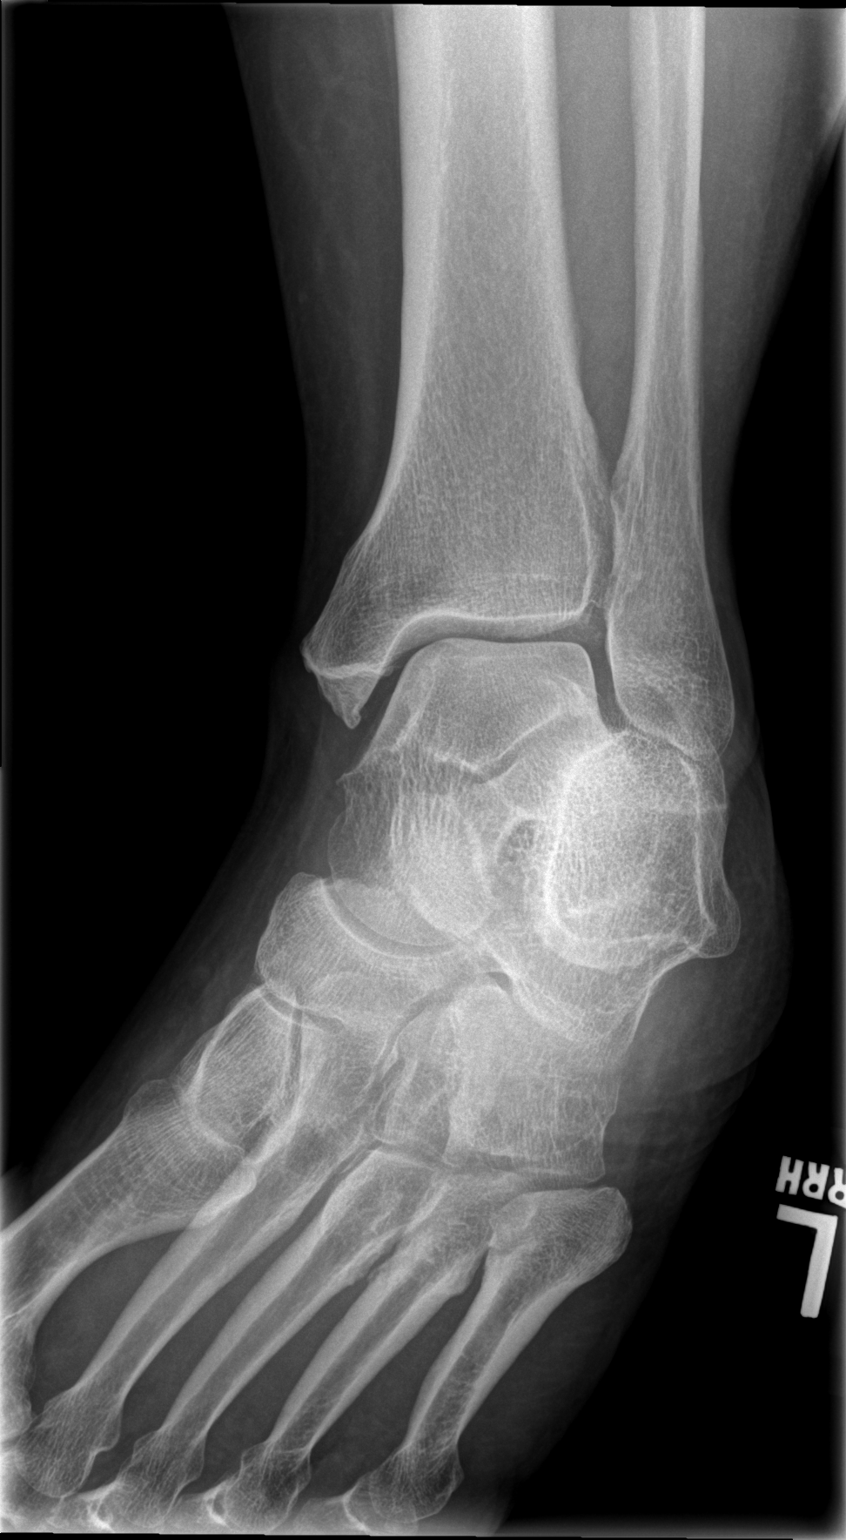

[x ankle lat left]
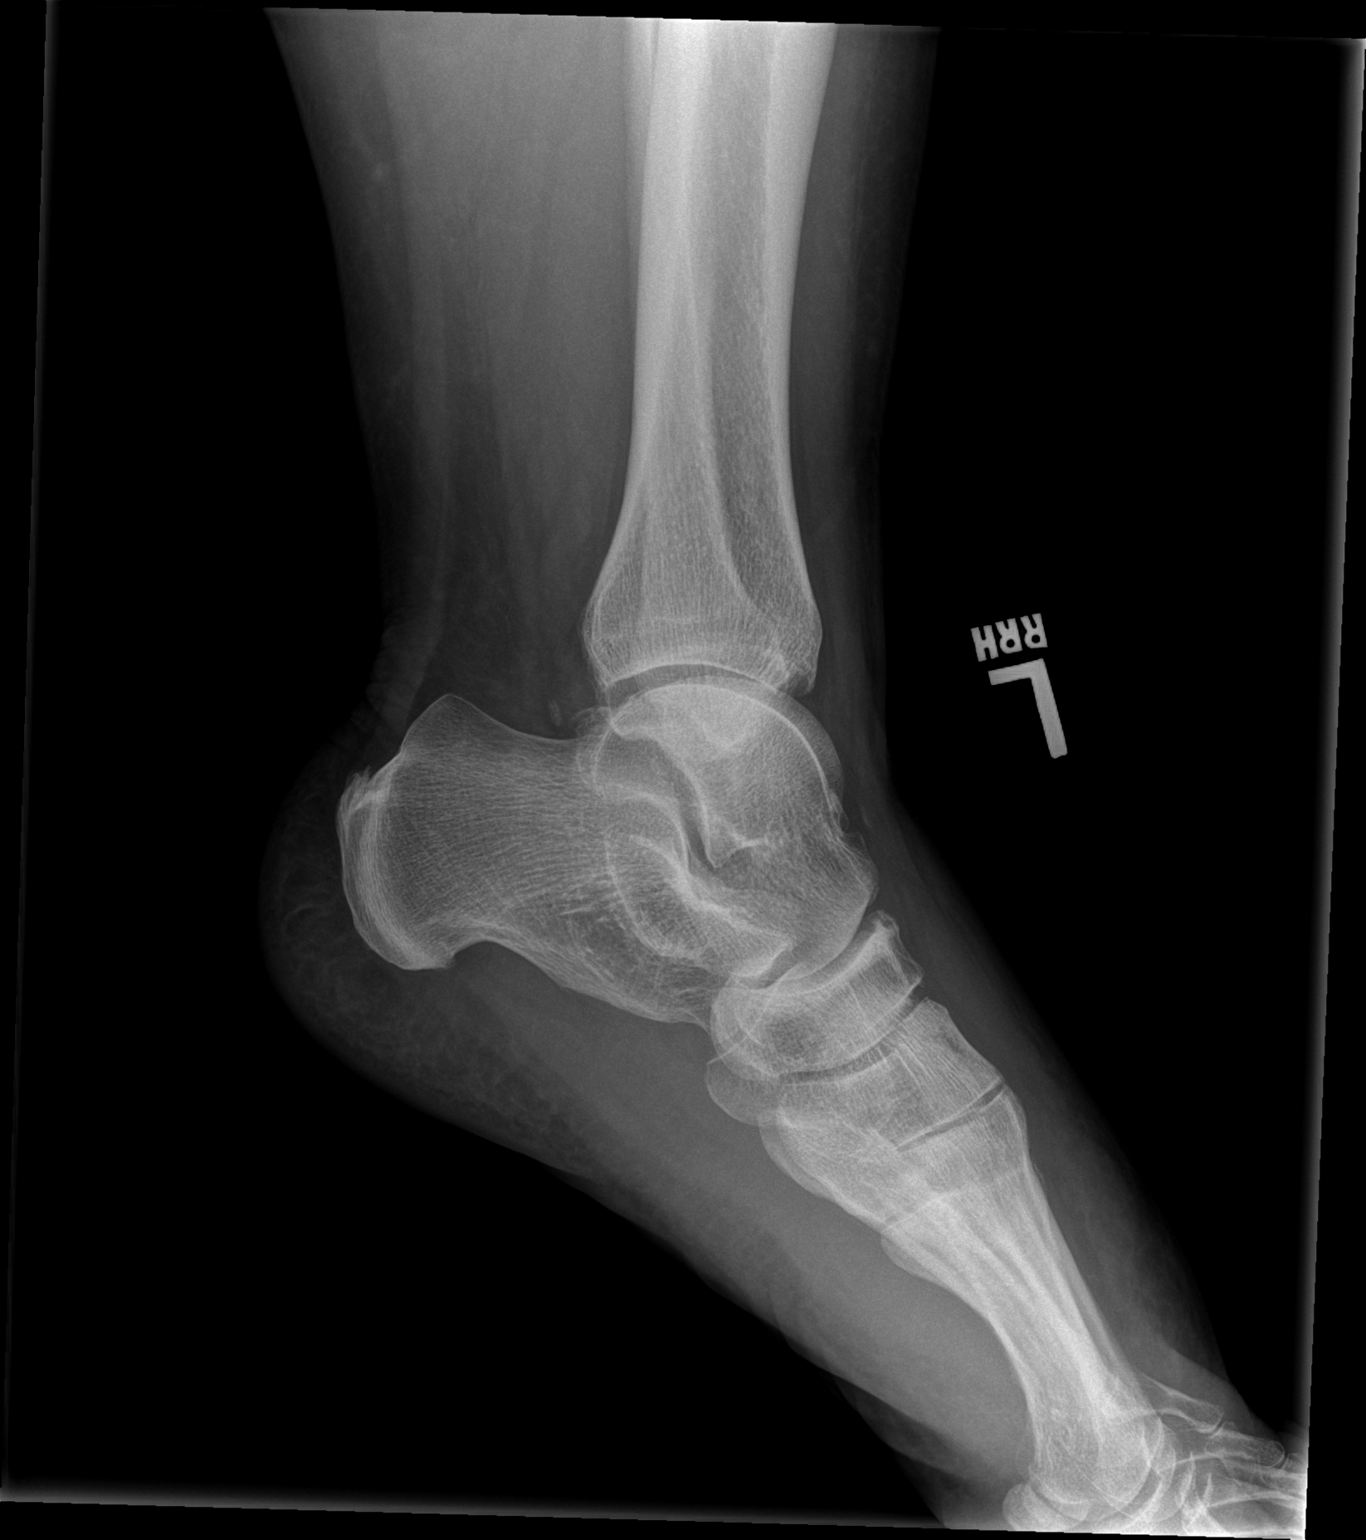

[3 of 3 positions shown; findings below may reference images not displayed]

FINDINGS: There is no evidence of fracture, dislocation, or joint effusion.
There is no evidence of arthropathy or other focal bone abnormality.
Soft tissues are unremarkable.
IMPRESSION: Normal left ankle.

## 2017-12-24 DIAGNOSIS — M545 Low back pain: Secondary | ICD-10-CM | POA: Diagnosis not present

## 2018-04-06 DIAGNOSIS — M545 Low back pain: Secondary | ICD-10-CM | POA: Diagnosis not present

## 2018-04-06 DIAGNOSIS — I10 Essential (primary) hypertension: Secondary | ICD-10-CM | POA: Diagnosis not present

## 2018-04-06 DIAGNOSIS — R7301 Impaired fasting glucose: Secondary | ICD-10-CM | POA: Diagnosis not present

## 2018-04-08 DIAGNOSIS — I10 Essential (primary) hypertension: Secondary | ICD-10-CM | POA: Diagnosis not present

## 2018-04-08 DIAGNOSIS — E782 Mixed hyperlipidemia: Secondary | ICD-10-CM | POA: Diagnosis not present

## 2018-04-08 DIAGNOSIS — Z0000 Encounter for general adult medical examination without abnormal findings: Secondary | ICD-10-CM | POA: Diagnosis not present

## 2018-04-08 DIAGNOSIS — R7301 Impaired fasting glucose: Secondary | ICD-10-CM | POA: Diagnosis not present

## 2018-06-10 DIAGNOSIS — J060 Acute laryngopharyngitis: Secondary | ICD-10-CM | POA: Diagnosis not present

## 2018-06-10 DIAGNOSIS — Z6841 Body Mass Index (BMI) 40.0 and over, adult: Secondary | ICD-10-CM | POA: Diagnosis not present

## 2018-09-20 DIAGNOSIS — E785 Hyperlipidemia, unspecified: Secondary | ICD-10-CM | POA: Diagnosis not present

## 2018-09-20 DIAGNOSIS — I10 Essential (primary) hypertension: Secondary | ICD-10-CM | POA: Diagnosis not present

## 2018-09-20 DIAGNOSIS — R42 Dizziness and giddiness: Secondary | ICD-10-CM | POA: Diagnosis not present

## 2018-09-20 DIAGNOSIS — R7301 Impaired fasting glucose: Secondary | ICD-10-CM | POA: Diagnosis not present

## 2018-09-20 DIAGNOSIS — E782 Mixed hyperlipidemia: Secondary | ICD-10-CM | POA: Diagnosis not present

## 2018-09-20 DIAGNOSIS — R002 Palpitations: Secondary | ICD-10-CM | POA: Diagnosis not present

## 2019-04-18 DIAGNOSIS — E782 Mixed hyperlipidemia: Secondary | ICD-10-CM | POA: Diagnosis not present

## 2019-04-18 DIAGNOSIS — E785 Hyperlipidemia, unspecified: Secondary | ICD-10-CM | POA: Diagnosis not present

## 2019-04-18 DIAGNOSIS — R7301 Impaired fasting glucose: Secondary | ICD-10-CM | POA: Diagnosis not present

## 2019-04-18 DIAGNOSIS — I10 Essential (primary) hypertension: Secondary | ICD-10-CM | POA: Diagnosis not present

## 2019-05-30 DIAGNOSIS — E782 Mixed hyperlipidemia: Secondary | ICD-10-CM | POA: Diagnosis not present

## 2019-05-30 DIAGNOSIS — Z0001 Encounter for general adult medical examination with abnormal findings: Secondary | ICD-10-CM | POA: Diagnosis not present

## 2019-05-30 DIAGNOSIS — R7301 Impaired fasting glucose: Secondary | ICD-10-CM | POA: Diagnosis not present

## 2019-05-30 DIAGNOSIS — I10 Essential (primary) hypertension: Secondary | ICD-10-CM | POA: Diagnosis not present

## 2019-12-20 DIAGNOSIS — Z20828 Contact with and (suspected) exposure to other viral communicable diseases: Secondary | ICD-10-CM | POA: Diagnosis not present

## 2020-03-06 DIAGNOSIS — L300 Nummular dermatitis: Secondary | ICD-10-CM | POA: Diagnosis not present

## 2020-04-02 DIAGNOSIS — Z20822 Contact with and (suspected) exposure to covid-19: Secondary | ICD-10-CM | POA: Diagnosis not present

## 2020-04-02 DIAGNOSIS — U071 COVID-19: Secondary | ICD-10-CM | POA: Diagnosis not present

## 2020-05-08 DIAGNOSIS — L300 Nummular dermatitis: Secondary | ICD-10-CM | POA: Diagnosis not present

## 2020-05-08 DIAGNOSIS — L821 Other seborrheic keratosis: Secondary | ICD-10-CM | POA: Diagnosis not present

## 2020-06-04 DIAGNOSIS — R002 Palpitations: Secondary | ICD-10-CM | POA: Diagnosis not present

## 2020-06-04 DIAGNOSIS — I10 Essential (primary) hypertension: Secondary | ICD-10-CM | POA: Diagnosis not present

## 2020-06-04 DIAGNOSIS — E785 Hyperlipidemia, unspecified: Secondary | ICD-10-CM | POA: Diagnosis not present

## 2020-06-04 DIAGNOSIS — E782 Mixed hyperlipidemia: Secondary | ICD-10-CM | POA: Diagnosis not present

## 2020-06-04 DIAGNOSIS — Z6841 Body Mass Index (BMI) 40.0 and over, adult: Secondary | ICD-10-CM | POA: Diagnosis not present

## 2020-06-04 DIAGNOSIS — R7301 Impaired fasting glucose: Secondary | ICD-10-CM | POA: Diagnosis not present

## 2020-06-04 DIAGNOSIS — Z136 Encounter for screening for cardiovascular disorders: Secondary | ICD-10-CM | POA: Diagnosis not present

## 2020-06-11 DIAGNOSIS — Z0001 Encounter for general adult medical examination with abnormal findings: Secondary | ICD-10-CM | POA: Diagnosis not present

## 2020-06-14 ENCOUNTER — Encounter (INDEPENDENT_AMBULATORY_CARE_PROVIDER_SITE_OTHER): Payer: Self-pay | Admitting: *Deleted

## 2020-07-06 DIAGNOSIS — I10 Essential (primary) hypertension: Secondary | ICD-10-CM | POA: Diagnosis not present

## 2020-07-06 DIAGNOSIS — R5383 Other fatigue: Secondary | ICD-10-CM | POA: Diagnosis not present

## 2020-07-06 DIAGNOSIS — Z6841 Body Mass Index (BMI) 40.0 and over, adult: Secondary | ICD-10-CM | POA: Diagnosis not present

## 2020-10-23 DIAGNOSIS — Z03818 Encounter for observation for suspected exposure to other biological agents ruled out: Secondary | ICD-10-CM | POA: Diagnosis not present

## 2020-10-23 DIAGNOSIS — Z20822 Contact with and (suspected) exposure to covid-19: Secondary | ICD-10-CM | POA: Diagnosis not present

## 2020-10-24 DIAGNOSIS — Z20822 Contact with and (suspected) exposure to covid-19: Secondary | ICD-10-CM | POA: Diagnosis not present

## 2021-01-10 DIAGNOSIS — U071 COVID-19: Secondary | ICD-10-CM | POA: Diagnosis not present

## 2021-01-21 DIAGNOSIS — Z8616 Personal history of COVID-19: Secondary | ICD-10-CM | POA: Diagnosis not present

## 2021-06-28 DIAGNOSIS — I10 Essential (primary) hypertension: Secondary | ICD-10-CM | POA: Diagnosis not present

## 2021-06-28 DIAGNOSIS — R7301 Impaired fasting glucose: Secondary | ICD-10-CM | POA: Diagnosis not present

## 2021-07-05 DIAGNOSIS — Z0001 Encounter for general adult medical examination with abnormal findings: Secondary | ICD-10-CM | POA: Diagnosis not present

## 2023-06-03 DIAGNOSIS — S335XXA Sprain of ligaments of lumbar spine, initial encounter: Secondary | ICD-10-CM | POA: Diagnosis not present

## 2023-06-24 DIAGNOSIS — S29012A Strain of muscle and tendon of back wall of thorax, initial encounter: Secondary | ICD-10-CM | POA: Diagnosis not present

## 2023-07-17 DIAGNOSIS — M545 Low back pain, unspecified: Secondary | ICD-10-CM | POA: Diagnosis not present

## 2023-07-17 DIAGNOSIS — M546 Pain in thoracic spine: Secondary | ICD-10-CM | POA: Diagnosis not present

## 2023-08-05 DIAGNOSIS — E782 Mixed hyperlipidemia: Secondary | ICD-10-CM | POA: Diagnosis not present

## 2023-08-05 DIAGNOSIS — R5383 Other fatigue: Secondary | ICD-10-CM | POA: Diagnosis not present

## 2023-08-05 DIAGNOSIS — R7301 Impaired fasting glucose: Secondary | ICD-10-CM | POA: Diagnosis not present

## 2023-08-05 DIAGNOSIS — Z0000 Encounter for general adult medical examination without abnormal findings: Secondary | ICD-10-CM | POA: Diagnosis not present

## 2023-08-19 DIAGNOSIS — Z0001 Encounter for general adult medical examination with abnormal findings: Secondary | ICD-10-CM | POA: Diagnosis not present

## 2023-08-19 DIAGNOSIS — R7301 Impaired fasting glucose: Secondary | ICD-10-CM | POA: Diagnosis not present

## 2023-08-19 DIAGNOSIS — I10 Essential (primary) hypertension: Secondary | ICD-10-CM | POA: Diagnosis not present

## 2023-08-19 DIAGNOSIS — E782 Mixed hyperlipidemia: Secondary | ICD-10-CM | POA: Diagnosis not present

## 2023-10-07 DIAGNOSIS — Z1211 Encounter for screening for malignant neoplasm of colon: Secondary | ICD-10-CM | POA: Diagnosis not present

## 2023-11-17 DIAGNOSIS — K5730 Diverticulosis of large intestine without perforation or abscess without bleeding: Secondary | ICD-10-CM | POA: Diagnosis not present

## 2023-11-17 DIAGNOSIS — Z800 Family history of malignant neoplasm of digestive organs: Secondary | ICD-10-CM | POA: Diagnosis not present

## 2023-11-17 DIAGNOSIS — K635 Polyp of colon: Secondary | ICD-10-CM | POA: Diagnosis not present

## 2023-11-17 DIAGNOSIS — I10 Essential (primary) hypertension: Secondary | ICD-10-CM | POA: Diagnosis not present

## 2023-11-17 DIAGNOSIS — Z1211 Encounter for screening for malignant neoplasm of colon: Secondary | ICD-10-CM | POA: Diagnosis not present

## 2024-02-11 DIAGNOSIS — R7301 Impaired fasting glucose: Secondary | ICD-10-CM | POA: Diagnosis not present

## 2024-02-11 DIAGNOSIS — E782 Mixed hyperlipidemia: Secondary | ICD-10-CM | POA: Diagnosis not present

## 2024-02-23 DIAGNOSIS — R0609 Other forms of dyspnea: Secondary | ICD-10-CM | POA: Diagnosis not present

## 2024-02-23 DIAGNOSIS — E782 Mixed hyperlipidemia: Secondary | ICD-10-CM | POA: Diagnosis not present

## 2024-02-23 DIAGNOSIS — Z800 Family history of malignant neoplasm of digestive organs: Secondary | ICD-10-CM | POA: Diagnosis not present

## 2024-02-23 DIAGNOSIS — I10 Essential (primary) hypertension: Secondary | ICD-10-CM | POA: Diagnosis not present

## 2024-02-23 DIAGNOSIS — R7301 Impaired fasting glucose: Secondary | ICD-10-CM | POA: Diagnosis not present

## 2024-04-11 DIAGNOSIS — U071 COVID-19: Secondary | ICD-10-CM | POA: Diagnosis not present

## 2024-04-11 DIAGNOSIS — R059 Cough, unspecified: Secondary | ICD-10-CM | POA: Diagnosis not present

## 2024-05-25 DIAGNOSIS — Z6841 Body Mass Index (BMI) 40.0 and over, adult: Secondary | ICD-10-CM | POA: Diagnosis not present

## 2024-05-25 DIAGNOSIS — I10 Essential (primary) hypertension: Secondary | ICD-10-CM | POA: Diagnosis not present

## 2024-08-18 DIAGNOSIS — R7301 Impaired fasting glucose: Secondary | ICD-10-CM | POA: Diagnosis not present

## 2024-08-18 DIAGNOSIS — E782 Mixed hyperlipidemia: Secondary | ICD-10-CM | POA: Diagnosis not present

## 2024-08-18 DIAGNOSIS — Z125 Encounter for screening for malignant neoplasm of prostate: Secondary | ICD-10-CM | POA: Diagnosis not present

## 2024-08-30 DIAGNOSIS — R7301 Impaired fasting glucose: Secondary | ICD-10-CM | POA: Diagnosis not present

## 2024-08-30 DIAGNOSIS — I10 Essential (primary) hypertension: Secondary | ICD-10-CM | POA: Diagnosis not present

## 2024-08-30 DIAGNOSIS — E782 Mixed hyperlipidemia: Secondary | ICD-10-CM | POA: Diagnosis not present

## 2024-09-30 ENCOUNTER — Emergency Department (HOSPITAL_COMMUNITY): Admission: EM | Admit: 2024-09-30 | Discharge: 2024-09-30 | Disposition: A | Discharge status: Home / Self Care

## 2024-09-30 ENCOUNTER — Encounter (HOSPITAL_COMMUNITY): Payer: Self-pay

## 2024-09-30 ENCOUNTER — Other Ambulatory Visit: Payer: Self-pay

## 2024-09-30 ENCOUNTER — Emergency Department (HOSPITAL_COMMUNITY)

## 2024-09-30 DIAGNOSIS — R079 Chest pain, unspecified: Secondary | ICD-10-CM | POA: Insufficient documentation

## 2024-09-30 DIAGNOSIS — Z7901 Long term (current) use of anticoagulants: Secondary | ICD-10-CM | POA: Diagnosis not present

## 2024-09-30 DIAGNOSIS — I10 Essential (primary) hypertension: Secondary | ICD-10-CM | POA: Insufficient documentation

## 2024-09-30 LAB — BASIC METABOLIC PANEL WITH GFR
Anion gap: 14 (ref 5–15)
BUN: 18 mg/dL (ref 6–20)
CO2: 21 mmol/L — ABNORMAL LOW (ref 22–32)
Calcium: 9.3 mg/dL (ref 8.9–10.3)
Chloride: 101 mmol/L (ref 98–111)
Creatinine, Ser: 0.81 mg/dL (ref 0.61–1.24)
GFR, Estimated: >60 mL/min
Glucose, Bld: 163 mg/dL — ABNORMAL HIGH (ref 70–99)
Potassium: 3.5 mmol/L (ref 3.5–5.1)
Sodium: 136 mmol/L (ref 135–145)

## 2024-09-30 LAB — CBC
HCT: 42.5 % (ref 39.0–52.0)
Hemoglobin: 15.4 g/dL (ref 13.0–17.0)
MCH: 30.9 pg (ref 26.0–34.0)
MCHC: 36.2 g/dL — ABNORMAL HIGH (ref 30.0–36.0)
MCV: 85.2 fL (ref 80.0–100.0)
Platelets: 229 K/uL (ref 150–400)
RBC: 4.99 MIL/uL (ref 4.22–5.81)
RDW: 13.1 % (ref 11.5–15.5)
WBC: 10.5 K/uL (ref 4.0–10.5)
nRBC: 0 % (ref 0.0–0.2)

## 2024-09-30 LAB — TROPONIN T, HIGH SENSITIVITY
Troponin T High Sensitivity: <6 ng/L (ref 0–19)
Troponin T High Sensitivity: <6 ng/L (ref 0–19)

## 2024-09-30 NOTE — ED Triage Notes (Signed)
 Fluctuating BP and HR reported by EMS. Pt reports significant CP on arrival.

## 2024-09-30 NOTE — ED Triage Notes (Signed)
 Pt coming via EMS from home for chest pain beginning 20 minutes prior to EMS arrival. Aspirin taken at 0420, pain started around 0500. Denies cardiac history. Hx of HTN. Mild ST elevation on EKG per EMS, no definitive stemi. Chest pain exacerbated by exertion.  Zofran  given by EMS. CBG 141 VSS

## 2024-09-30 NOTE — Discharge Instructions (Signed)
 Please follow-up with your primary doctor.  We are referring you to cardiology.  They should call to schedule appointment, however if you do not hear from them please call yourself.  Return for fevers, chills, chest pain returns, difficulty breathing, lightheadedness, you pass out, palpitations, or any new or worsening symptoms that are concerning to you.

## 2024-09-30 NOTE — ED Provider Notes (Signed)
 " [facility] EMERGENCY DEPARTMENT AT [facility] Provider Note   CSN: [id number] Arrival date & time: 09/30/24  9381     Patient presents with: Chest Pain   Shawn Glover is a 59 y.o. male.   Is a 59 year old male presenting emergency department for chest pain.  Central.  Described as pressure.  Felt like bad heartburn took Zantac.  Symptoms improving of nearly resolved.  Did have some nausea associated with the chest pain no shortness of breath lightheaded dizziness palpitations.  Low risk for PE based on Wells criteria.   Chest Pain      Prior to Admission medications  Medication Sig Start Date End Date Taking? Authorizing Provider  amLODipine  (NORVASC ) 10 MG tablet Take 10 mg by mouth daily.    [provider]  baclofen  (LIORESAL ) 10 MG tablet Take 1 tablet (10 mg total) by mouth 3 (three) times daily. As needed for muscle spasm 07/24/16   Josefina Chew, MD  ondansetron  (ZOFRAN ) 4 MG tablet Take 1 tablet (4 mg total) by mouth every 8 (eight) hours as needed for nausea or vomiting. 07/24/16   Josefina Chew, MD  oxyCODONE  (ROXICODONE ) 5 MG immediate release tablet Take 1-2 tablets (5-10 mg total) by mouth every 4 (four) hours as needed for severe pain. 07/24/16   Josefina Chew, MD  rivaroxaban  (XARELTO ) 10 MG TABS tablet Take 1 tablet (10 mg total) by mouth daily. 07/24/16   Josefina Chew, MD  sennosides-docusate sodium  (SENOKOT-S) 8.6-50 MG tablet Take 2 tablets by mouth daily. 07/24/16   Josefina Chew, MD    Allergies: Patient has no known allergies.    Review of Systems  Cardiovascular:  Positive for chest pain.    Updated Vital Signs BP (!) 141/88 (BP Location: Left Arm)   Pulse 76   Temp 97.8 F (36.6 C) (Oral)   Resp 16   Ht 5' 7 (1.702 m)   Wt 99.8 kg   SpO2 96%   BMI 34.46 kg/m   Physical Exam Vitals and nursing note reviewed.  Constitutional:      General: He is not in acute distress.    Appearance: He is not toxic-appearing.   HENT:     Head: Normocephalic.  Cardiovascular:     Rate and Rhythm: Normal rate.     Pulses:          Radial pulses are 2+ on the right side and 2+ on the left side.     Heart sounds: Normal heart sounds.  Pulmonary:     Effort: Pulmonary effort is normal.     Breath sounds: Normal breath sounds. No wheezing, rhonchi or rales.  Chest:     Chest wall: No tenderness.  Musculoskeletal:     Right lower leg: No tenderness. No edema.     Left lower leg: No tenderness. No edema.  Skin:    General: Skin is warm and dry.     Capillary Refill: Capillary refill takes less than 2 seconds.  Neurological:     General: No focal deficit present.     Mental Status: He is alert.  Psychiatric:        Mood and Affect: Mood normal.        Behavior: Behavior normal.     (all labs ordered are listed, but only abnormal results are displayed) Labs Reviewed  BASIC METABOLIC PANEL WITH GFR - Abnormal; Notable for the following components:      Result Value   CO2 21 (*)  Glucose, Bld 163 (*)    All other components within normal limits  CBC - Abnormal; Notable for the following components:   MCHC 36.2 (*)    All other components within normal limits  TROPONIN T, HIGH SENSITIVITY  TROPONIN T, HIGH SENSITIVITY    EKG: EKG Interpretation Date/Time:  Friday September 30 2024 07:39:46 EST Ventricular Rate:  65 PR Interval:  160 QRS Duration:  146 QT Interval:  418 QTC Calculation: 434 R Axis:   78  Text Interpretation: Normal sinus rhythm Right bundle branch block Confirmed by Neysa Clap [id number]) on 09/30/2024 9:11:32 AM  Radiology: [facility] Chest 2 View Result Date: 09/30/2024 CLINICAL DATA:  59 year old male with right chest and upper abdominal pain this morning. EXAM: CHEST - 2 VIEW COMPARISON:  None Available. FINDINGS: The heart size and mediastinal contours are within normal limits. Both lungs are clear. The visualized skeletal structures are unremarkable. Negative visible bowel gas  pattern. IMPRESSION: No acute cardiopulmonary abnormality. Electronically Signed   By: VEAR Hurst M.D.   On: 09/30/2024 07:12     Procedures   Medications Ordered in the ED - No data to display  Clinical Course as of 09/30/24 1052  Fri Sep 30, 2024  1021 CBC(!) No leukocytosis.  No anemia that would explain his symptoms [TY]  1021 Basic metabolic panel(!) Comprehensive panel without electrolyte abnormalities.  Normal kidney function.  Hyperglycemic, but does not appear to be in DKA with normal anion gap [TY]  1021 Troponin T High Sensitivity: <6 Troponins negative x 2.  ACS unlikely.  Heart score 4 [TY]  1048 Offered admission for patient given his heart score.  He declined and would like to follow-up outpatient.  This is not unreasonable given his symptoms have essentially resolved and with reassuring workup.  Will discharge in stable condition at patient's request. [TY]    Clinical Course User Index [TY] Neysa Clap PARAS, DO                                 Medical Decision Making Is a 59 year old male presenting emergency department with chest pain.  Past medical history includes hypertension, hyperlipidemia and obesity.  Denies prior cardiac history.  EKG similar to prior with no ischemic changes.  Initial troponin negative.  Lower suspicion for PE based on history provided by patient.  Low risk based on Wells criteria.  Chest x-ray without widened mediastinum or pneumothorax on my independent review.  Radiology also reading with out acute abnormality.  His CBC and bmp unremarkable as noted in the ED course.  Awaiting delta troponin.  Amount and/or Complexity of Data Reviewed External Data Reviewed:     Details: No prior cardiac testing Labs: ordered. Decision-making details documented in ED Course. Radiology: ordered and independent interpretation performed. ECG/medicine tests: independent interpretation performed.  Risk Decision regarding hospitalization. Diagnosis or treatment  significantly limited by social determinants of health.      Final diagnoses:  None    ED Discharge Orders     None          Neysa Clap PARAS, DO 09/30/24 1052  "

## 2024-09-30 NOTE — ED Provider Triage Note (Signed)
 Emergency Medicine Provider Triage Evaluation Note  Norleen DELENA Speed , a 59 y.o. male  was evaluated in triage.  Pt complains of CP. Woke up with nausea and noticed some sternal CP. Took zantac and states CP improved to a 5/10 (was 8 earlier).   Had nausea earlier but now resolved. Not SOB. No leg swelling. No coughing.   Review of Systems  Positive: CP and nausea Negative: Fever   Physical Exam  BP 113/80   Pulse (!) 59   Temp 97.7 F (36.5 C) (Oral)   Resp 20   Ht 5' 7 (1.702 m)   Wt 99.8 kg   SpO2 100%   BMI 34.46 kg/m  Gen:   Awake, no distress   Resp:  Normal effort  MSK:   Moves extremities without difficulty  Other:  Well appearing  Medical Decision Making  Medically screening exam initiated at 7:15 AM.  Appropriate orders placed.  Norleen DELENA Speed was informed that the remainder of the evaluation will be completed by another provider, this initial triage assessment does not replace that evaluation, and the importance of remaining in the ED until their evaluation is complete.  CP workup   Neldon Hamp RAMAN, [place] 09/30/24 9277

## 2024-10-03 ENCOUNTER — Encounter (HOSPITAL_COMMUNITY): Payer: Self-pay | Admitting: Emergency Medicine

## 2024-10-03 ENCOUNTER — Emergency Department (HOSPITAL_COMMUNITY)

## 2024-10-03 ENCOUNTER — Observation Stay (HOSPITAL_COMMUNITY)
Admission: EM | Admit: 2024-10-03 | Discharge: 2024-10-04 | Disposition: A | Discharge status: Home / Self Care | Attending: General Surgery | Admitting: General Surgery

## 2024-10-03 ENCOUNTER — Other Ambulatory Visit: Payer: Self-pay

## 2024-10-03 ENCOUNTER — Emergency Department (HOSPITAL_COMMUNITY): Admitting: Certified Registered Nurse Anesthetist

## 2024-10-03 ENCOUNTER — Encounter (HOSPITAL_COMMUNITY)
Admission: EM | Disposition: A | Discharge status: Home / Self Care | Payer: Self-pay | Source: Home / Self Care | Attending: Emergency Medicine

## 2024-10-03 DIAGNOSIS — Z87891 Personal history of nicotine dependence: Secondary | ICD-10-CM | POA: Insufficient documentation

## 2024-10-03 DIAGNOSIS — K8000 Calculus of gallbladder with acute cholecystitis without obstruction: Secondary | ICD-10-CM | POA: Diagnosis not present

## 2024-10-03 DIAGNOSIS — E876 Hypokalemia: Secondary | ICD-10-CM | POA: Diagnosis not present

## 2024-10-03 DIAGNOSIS — I10 Essential (primary) hypertension: Secondary | ICD-10-CM | POA: Insufficient documentation

## 2024-10-03 DIAGNOSIS — R739 Hyperglycemia, unspecified: Secondary | ICD-10-CM | POA: Insufficient documentation

## 2024-10-03 DIAGNOSIS — Z96652 Presence of left artificial knee joint: Secondary | ICD-10-CM | POA: Diagnosis not present

## 2024-10-03 DIAGNOSIS — K828 Other specified diseases of gallbladder: Secondary | ICD-10-CM | POA: Insufficient documentation

## 2024-10-03 DIAGNOSIS — R7401 Elevation of levels of liver transaminase levels: Secondary | ICD-10-CM | POA: Diagnosis not present

## 2024-10-03 DIAGNOSIS — R1010 Upper abdominal pain, unspecified: Secondary | ICD-10-CM | POA: Diagnosis present

## 2024-10-03 DIAGNOSIS — K819 Cholecystitis, unspecified: Secondary | ICD-10-CM | POA: Diagnosis present

## 2024-10-03 DIAGNOSIS — Z79899 Other long term (current) drug therapy: Secondary | ICD-10-CM | POA: Diagnosis not present

## 2024-10-03 LAB — COMPREHENSIVE METABOLIC PANEL WITH GFR
ALT: 64 U/L — ABNORMAL HIGH (ref 0–44)
AST: 36 U/L (ref 15–41)
Albumin: 4.1 g/dL (ref 3.5–5.0)
Alkaline Phosphatase: 74 U/L (ref 38–126)
Anion gap: 13 (ref 5–15)
BUN: 14 mg/dL (ref 6–20)
CO2: 26 mmol/L (ref 22–32)
Calcium: 9.1 mg/dL (ref 8.9–10.3)
Chloride: 98 mmol/L (ref 98–111)
Creatinine, Ser: 1.09 mg/dL (ref 0.61–1.24)
GFR, Estimated: >60 mL/min
Glucose, Bld: 173 mg/dL — ABNORMAL HIGH (ref 70–99)
Potassium: 3.4 mmol/L — ABNORMAL LOW (ref 3.5–5.1)
Sodium: 137 mmol/L (ref 135–145)
Total Bilirubin: 0.6 mg/dL (ref 0.0–1.2)
Total Protein: 6.8 g/dL (ref 6.5–8.1)

## 2024-10-03 LAB — CBC WITH DIFFERENTIAL/PLATELET
Abs Immature Granulocytes: 0.05 10*3/uL (ref 0.00–0.07)
Basophils Absolute: 0.1 10*3/uL (ref 0.0–0.1)
Basophils Relative: 0 %
Eosinophils Absolute: 0.1 10*3/uL (ref 0.0–0.5)
Eosinophils Relative: 1 %
HCT: 41.3 % (ref 39.0–52.0)
Hemoglobin: 14.6 g/dL (ref 13.0–17.0)
Immature Granulocytes: 0 %
Lymphocytes Relative: 10 %
Lymphs Abs: 1.3 10*3/uL (ref 0.7–4.0)
MCH: 30.3 pg (ref 26.0–34.0)
MCHC: 35.4 g/dL (ref 30.0–36.0)
MCV: 85.7 fL (ref 80.0–100.0)
Monocytes Absolute: 0.9 10*3/uL (ref 0.1–1.0)
Monocytes Relative: 7 %
Neutro Abs: 10.9 10*3/uL — ABNORMAL HIGH (ref 1.7–7.7)
Neutrophils Relative %: 82 %
Platelets: 220 10*3/uL (ref 150–400)
RBC: 4.82 MIL/uL (ref 4.22–5.81)
RDW: 12.9 % (ref 11.5–15.5)
WBC: 13.2 10*3/uL — ABNORMAL HIGH (ref 4.0–10.5)
nRBC: 0 % (ref 0.0–0.2)

## 2024-10-03 LAB — TROPONIN T, HIGH SENSITIVITY
Troponin T High Sensitivity: <6 ng/L (ref 0–19)
Troponin T High Sensitivity: <6 ng/L (ref 0–19)

## 2024-10-03 LAB — LIPASE, BLOOD: Lipase: 22 U/L (ref 11–51)

## 2024-10-03 MED ORDER — METHOCARBAMOL 500 MG PO TABS: 500.0000 mg | ORAL_TABLET | Freq: Three times a day (TID) | ORAL | Status: DC | PRN

## 2024-10-03 MED ORDER — METHOCARBAMOL 1000 MG/10ML IJ SOLN: 500.0000 mg | Freq: Three times a day (TID) | INTRAMUSCULAR | Status: DC | PRN

## 2024-10-03 MED ORDER — SODIUM CHLORIDE 0.9 % IR SOLN
Status: DC | PRN
  Administered 2024-10-03: 1

## 2024-10-03 MED ORDER — FENTANYL CITRATE (PF) 100 MCG/2ML IJ SOLN
INTRAMUSCULAR | Status: AC
  Filled 2024-10-03: qty 2

## 2024-10-03 MED ORDER — ONDANSETRON HCL 4 MG/2ML IJ SOLN
INTRAMUSCULAR | Status: DC | PRN
  Administered 2024-10-03: 4 mg via INTRAVENOUS

## 2024-10-03 MED ORDER — POTASSIUM CHLORIDE CRYS ER 20 MEQ PO TBCR: 20.0000 meq | EXTENDED_RELEASE_TABLET | Freq: Two times a day (BID) | ORAL | 0 refills | Status: AC

## 2024-10-03 MED ORDER — OXYCODONE HCL 5 MG PO TABS: 5.0000 mg | ORAL_TABLET | ORAL | Status: DC | PRN

## 2024-10-03 MED ORDER — 0.9 % SODIUM CHLORIDE (POUR BTL) OPTIME
TOPICAL | Status: DC | PRN
  Administered 2024-10-03: 1000 mL

## 2024-10-03 MED ORDER — ONDANSETRON HCL 4 MG/2ML IJ SOLN: 4.0000 mg | Freq: Four times a day (QID) | INTRAMUSCULAR | Status: DC | PRN

## 2024-10-03 MED ORDER — PHENYLEPHRINE HCL-NACL 20-0.9 MG/250ML-% IV SOLN
INTRAVENOUS | Status: DC | PRN
  Administered 2024-10-03: 35 ug/min via INTRAVENOUS
  Administered 2024-10-03: 50 ug/min via INTRAVENOUS

## 2024-10-03 MED ORDER — SODIUM CHLORIDE 0.9 % IV SOLN: 2.0000 g | INTRAVENOUS | Status: DC

## 2024-10-03 MED ORDER — CHLORHEXIDINE GLUCONATE 0.12 % MT SOLN
OROMUCOSAL | Status: AC
  Administered 2024-10-03: 15 mL via OROMUCOSAL
  Filled 2024-10-03: qty 15

## 2024-10-03 MED ORDER — ACETAMINOPHEN 500 MG PO TABS
1000.0000 mg | ORAL_TABLET | ORAL | Status: AC
  Administered 2024-10-03: 1000 mg via ORAL
  Filled 2024-10-03: qty 2

## 2024-10-03 MED ORDER — PROPOFOL 10 MG/ML IV BOLUS
INTRAVENOUS | Status: AC
  Filled 2024-10-03: qty 20

## 2024-10-03 MED ORDER — LIDOCAINE 2% (20 MG/ML) 5 ML SYRINGE
INTRAMUSCULAR | Status: DC | PRN
  Administered 2024-10-03: 80 mg via INTRAVENOUS

## 2024-10-03 MED ORDER — LACTATED RINGERS IV SOLN: INTRAVENOUS | Status: DC

## 2024-10-03 MED ORDER — MORPHINE SULFATE (PF) 4 MG/ML IV SOLN
4.0000 mg | Freq: Once | INTRAVENOUS | Status: AC
  Administered 2024-10-03: 4 mg via INTRAVENOUS
  Filled 2024-10-03: qty 1

## 2024-10-03 MED ORDER — DEXAMETHASONE SOD PHOSPHATE PF 10 MG/ML IJ SOLN
INTRAMUSCULAR | Status: AC
  Filled 2024-10-03: qty 1

## 2024-10-03 MED ORDER — SODIUM CHLORIDE 0.9 % IV SOLN: INTRAVENOUS | Status: DC

## 2024-10-03 MED ORDER — OXYCODONE HCL 5 MG PO TABS: 5.0000 mg | ORAL_TABLET | ORAL | 0 refills | Status: AC | PRN

## 2024-10-03 MED ORDER — ONDANSETRON HCL 4 MG/2ML IJ SOLN: 4.0000 mg | INTRAMUSCULAR | Status: DC | PRN

## 2024-10-03 MED ORDER — BUPIVACAINE-EPINEPHRINE 0.25% -1:200000 IJ SOLN
INTRAMUSCULAR | Status: DC | PRN
  Administered 2024-10-03: 10 mL

## 2024-10-03 MED ORDER — INDOCYANINE GREEN 25 MG IJ SOLR
1.2500 mg | Freq: Once | INTRAMUSCULAR | Status: AC
  Administered 2024-10-03: 1.25 mg via INTRAVENOUS

## 2024-10-03 MED ORDER — ROCURONIUM BROMIDE 10 MG/ML (PF) SYRINGE
PREFILLED_SYRINGE | INTRAVENOUS | Status: AC
  Filled 2024-10-03: qty 10

## 2024-10-03 MED ORDER — HYDROMORPHONE HCL 1 MG/ML IJ SOLN
0.5000 mg | INTRAMUSCULAR | Status: DC | PRN
  Administered 2024-10-03 (×2): 1 mg via INTRAVENOUS
  Filled 2024-10-03 (×2): qty 1

## 2024-10-03 MED ORDER — MIDAZOLAM HCL (PF) 2 MG/2ML IJ SOLN
INTRAMUSCULAR | Status: DC | PRN
  Administered 2024-10-03: 2 mg via INTRAVENOUS

## 2024-10-03 MED ORDER — BUPIVACAINE-EPINEPHRINE (PF) 0.25% -1:200000 IJ SOLN
INTRAMUSCULAR | Status: AC
  Filled 2024-10-03: qty 30

## 2024-10-03 MED ORDER — FENTANYL CITRATE (PF) 250 MCG/5ML IJ SOLN
INTRAMUSCULAR | Status: DC | PRN
  Administered 2024-10-03: 100 ug via INTRAVENOUS

## 2024-10-03 MED ORDER — MIDAZOLAM HCL 2 MG/2ML IJ SOLN
INTRAMUSCULAR | Status: AC
  Filled 2024-10-03: qty 2

## 2024-10-03 MED ORDER — POTASSIUM CHLORIDE CRYS ER 20 MEQ PO TBCR
40.0000 meq | EXTENDED_RELEASE_TABLET | Freq: Once | ORAL | Status: AC
  Administered 2024-10-03: 40 meq via ORAL
  Filled 2024-10-03: qty 2

## 2024-10-03 MED ORDER — BACLOFEN 10 MG PO TABS: 10.0000 mg | ORAL_TABLET | Freq: Three times a day (TID) | ORAL | Status: DC | PRN

## 2024-10-03 MED ORDER — PANTOPRAZOLE SODIUM 40 MG PO TBEC: 40.0000 mg | DELAYED_RELEASE_TABLET | Freq: Every day | ORAL | 0 refills | Status: AC

## 2024-10-03 MED ORDER — ORAL CARE MOUTH RINSE: 15.0000 mL | Freq: Once | OROMUCOSAL | Status: AC

## 2024-10-03 MED ORDER — LIDOCAINE 2% (20 MG/ML) 5 ML SYRINGE
INTRAMUSCULAR | Status: AC
  Filled 2024-10-03: qty 5

## 2024-10-03 MED ORDER — HYDROMORPHONE HCL 1 MG/ML IJ SOLN
0.5000 mg | Freq: Once | INTRAMUSCULAR | Status: AC
  Administered 2024-10-03: 0.5 mg via INTRAVENOUS
  Filled 2024-10-03: qty 1

## 2024-10-03 MED ORDER — CHLORHEXIDINE GLUCONATE 0.12 % MT SOLN: 15.0000 mL | Freq: Once | OROMUCOSAL | Status: AC

## 2024-10-03 MED ORDER — ROCURONIUM BROMIDE 10 MG/ML (PF) SYRINGE
PREFILLED_SYRINGE | INTRAVENOUS | Status: DC | PRN
  Administered 2024-10-03: 50 mg via INTRAVENOUS

## 2024-10-03 MED ORDER — SODIUM CHLORIDE 0.9 % IV SOLN
2.0000 g | Freq: Once | INTRAVENOUS | Status: AC
  Administered 2024-10-03: 2 g via INTRAVENOUS
  Filled 2024-10-03: qty 20

## 2024-10-03 MED ORDER — ENOXAPARIN SODIUM 40 MG/0.4ML IJ SOSY
40.0000 mg | PREFILLED_SYRINGE | INTRAMUSCULAR | Status: DC
  Administered 2024-10-04: 40 mg via SUBCUTANEOUS
  Filled 2024-10-03: qty 0.4

## 2024-10-03 MED ORDER — PIPERACILLIN-TAZOBACTAM 3.375 G IVPB
3.3750 g | Freq: Three times a day (TID) | INTRAVENOUS | Status: DC
  Administered 2024-10-03 – 2024-10-04 (×2): 3.375 g via INTRAVENOUS
  Filled 2024-10-03 (×4): qty 50

## 2024-10-03 MED ORDER — ONDANSETRON HCL 4 MG/2ML IJ SOLN
4.0000 mg | Freq: Once | INTRAMUSCULAR | Status: AC
  Administered 2024-10-03: 4 mg via INTRAVENOUS
  Filled 2024-10-03: qty 2

## 2024-10-03 MED ORDER — PANTOPRAZOLE SODIUM 40 MG IV SOLR
40.0000 mg | Freq: Once | INTRAVENOUS | Status: AC
  Administered 2024-10-03: 40 mg via INTRAVENOUS
  Filled 2024-10-03: qty 10

## 2024-10-03 MED ORDER — ONDANSETRON 4 MG PO TBDP: 4.0000 mg | ORAL_TABLET | Freq: Four times a day (QID) | ORAL | Status: DC | PRN

## 2024-10-03 MED ORDER — SIMETHICONE 80 MG PO CHEW: 40.0000 mg | CHEWABLE_TABLET | Freq: Four times a day (QID) | ORAL | Status: DC | PRN

## 2024-10-03 MED ORDER — ONDANSETRON 4 MG PO TBDP: 4.0000 mg | ORAL_TABLET | Freq: Three times a day (TID) | ORAL | 0 refills | Status: AC | PRN

## 2024-10-03 MED ORDER — IOHEXOL 350 MG/ML SOLN
100.0000 mL | Freq: Once | INTRAVENOUS | Status: AC | PRN
  Administered 2024-10-03: 100 mL via INTRAVENOUS

## 2024-10-03 MED ORDER — PROPOFOL 10 MG/ML IV BOLUS
INTRAVENOUS | Status: DC | PRN
  Administered 2024-10-03: 120 mg via INTRAVENOUS
  Administered 2024-10-03: 50 mg via INTRAVENOUS

## 2024-10-03 MED ORDER — DEXAMETHASONE SOD PHOSPHATE PF 10 MG/ML IJ SOLN
INTRAMUSCULAR | Status: DC | PRN
  Administered 2024-10-03: 5 mg via INTRAVENOUS

## 2024-10-03 MED ORDER — AMLODIPINE BESYLATE 10 MG PO TABS
10.0000 mg | ORAL_TABLET | Freq: Every day | ORAL | Status: DC
  Administered 2024-10-04: 10 mg via ORAL
  Filled 2024-10-03: qty 1

## 2024-10-03 MED ORDER — PHENYLEPHRINE 80 MCG/ML (10ML) SYRINGE FOR IV PUSH (FOR BLOOD PRESSURE SUPPORT)
PREFILLED_SYRINGE | INTRAVENOUS | Status: DC | PRN
  Administered 2024-10-03 (×3): 80 ug via INTRAVENOUS

## 2024-10-03 MED ORDER — ONDANSETRON HCL 4 MG/2ML IJ SOLN
INTRAMUSCULAR | Status: AC
  Filled 2024-10-03: qty 2

## 2024-10-03 MED ORDER — ACETAMINOPHEN 500 MG PO TABS
1000.0000 mg | ORAL_TABLET | Freq: Four times a day (QID) | ORAL | Status: DC
  Administered 2024-10-03 – 2024-10-04 (×3): 1000 mg via ORAL
  Filled 2024-10-03 (×3): qty 2

## 2024-10-03 MED ORDER — HEMOSTATIC AGENTS (NO CHARGE) OPTIME
TOPICAL | Status: DC | PRN
  Administered 2024-10-03 (×2): 1 via TOPICAL

## 2024-10-03 MED ORDER — ALUM & MAG HYDROXIDE-SIMETH 200-200-20 MG/5ML PO SUSP
30.0000 mL | Freq: Once | ORAL | Status: AC
  Administered 2024-10-03: 30 mL via ORAL
  Filled 2024-10-03: qty 30

## 2024-10-03 MED ORDER — SUGAMMADEX SODIUM 200 MG/2ML IV SOLN
INTRAVENOUS | Status: DC | PRN
  Administered 2024-10-03: 200 mg via INTRAVENOUS

## 2024-10-03 NOTE — Anesthesia Preprocedure Evaluation (Addendum)
"                                    Anesthesia Evaluation  Patient identified by MRN, date of birth, ID band Patient awake    Reviewed: Allergy & Precautions, NPO status , Patient's Chart, lab work & pertinent test results  Airway Mallampati: III  TM Distance: >3 FB Neck ROM: Full    Dental  (+) Dental Advisory Given, Teeth Intact   Pulmonary former smoker   Pulmonary exam normal breath sounds clear to auscultation       Cardiovascular hypertension (154/104 preop), Pt. on medications  Rhythm:Regular Rate:Tachycardia     Neuro/Psych  Headaches  negative psych ROS   GI/Hepatic ,GERD  Controlled and Medicated,,Cholecystitis    Endo/Other  Obesity BMI 36  Renal/GU negative Renal ROS     Musculoskeletal  (+) Arthritis , Osteoarthritis,    Abdominal  (+) + obese  Peds  Hematology negative hematology ROS (+)   Anesthesia Other Findings   Reproductive/Obstetrics                              Anesthesia Physical Anesthesia Plan  ASA: 2  Anesthesia Plan: General   Post-op Pain Management: Tylenol  PO (pre-op)*, Toradol  IV (intra-op)*, Ketamine IV* and Dilaudid  IV   Induction: Intravenous  PONV Risk Score and Plan: 3 and Ondansetron , Dexamethasone , Midazolam  and Treatment may vary due to age or medical condition  Airway Management Planned: Oral ETT  Additional Equipment: None  Intra-op Plan:   Post-operative Plan: Extubation in OR  Informed Consent: I have reviewed the patients History and Physical, chart, labs and discussed the procedure including the risks, benefits and alternatives for the proposed anesthesia with the patient or authorized representative who has indicated his/her understanding and acceptance.     Dental advisory given  Plan Discussed with: CRNA  Anesthesia Plan Comments:          Anesthesia Quick Evaluation  "

## 2024-10-03 NOTE — ED Provider Notes (Signed)
 " [place] EMERGENCY DEPARTMENT AT [facility] Provider Note   CSN: [id number] Arrival date & time: 10/03/24  0155     Patient presents with: Chest Pain   Shawn Glover is a 59 y.o. male.   The history is provided by the patient.  Chest Pain  He has history of hypertension, hyperlipidemia, GERD, irritable bowel syndrome and comes in because of ongoing chest pain.  He was in the emergency department 2 nights ago with pain which is in the substernal area.  His workup was negative but pain never went away.  It has been constant since then but got worse today.  Pain is now radiating into the upper abdomen and into the back.  He denies any nausea or vomiting.  There was some mild dyspnea which was transient and also some mild diaphoresis which was transient.  He took aspirin at home and EMS gave him nitroglycerin which gave partial relief of pain.  He also notes that it is worse if he lays completely flat.  Eating does not affected.  He is a non-smoker and denies history of diabetes and denies family history of premature coronary atherosclerosis.    Prior to Admission medications  Medication Sig Start Date End Date Taking? Authorizing Provider  amLODipine  (NORVASC ) 10 MG tablet Take 10 mg by mouth daily.    [provider]  baclofen  (LIORESAL ) 10 MG tablet Take 1 tablet (10 mg total) by mouth 3 (three) times daily. As needed for muscle spasm 07/24/16   Josefina Chew, MD  ondansetron  (ZOFRAN ) 4 MG tablet Take 1 tablet (4 mg total) by mouth every 8 (eight) hours as needed for nausea or vomiting. 07/24/16   Josefina Chew, MD  oxyCODONE  (ROXICODONE ) 5 MG immediate release tablet Take 1-2 tablets (5-10 mg total) by mouth every 4 (four) hours as needed for severe pain. 07/24/16   Josefina Chew, MD  rivaroxaban  (XARELTO ) 10 MG TABS tablet Take 1 tablet (10 mg total) by mouth daily. 07/24/16   Josefina Chew, MD  sennosides-docusate sodium  (SENOKOT-S) 8.6-50 MG tablet Take 2  tablets by mouth daily. 07/24/16   Josefina Chew, MD    Allergies: Patient has no known allergies.    Review of Systems  Cardiovascular:  Positive for chest pain.  All other systems reviewed and are negative.   Updated Vital Signs BP (!) 163/84   Pulse 85   Temp 98.5 F (36.9 C) (Oral)   Resp 10   Ht 5' 7 (1.702 m)   Wt 104.6 kg   SpO2 97%   BMI 36.12 kg/m   Physical Exam Vitals and nursing note reviewed.   59 year old male, resting comfortably and in no acute distress. Vital signs are significant for elevated blood pressure. Oxygen saturation is 97%, which is normal. Head is normocephalic and atraumatic. PERRLA, EOMI. Oropharynx is clear. Neck is nontender and supple. Lungs are clear without rales, wheezes, or rhonchi. Chest is nontender. Heart has regular rate and rhythm without murmur. Abdomen is soft, flat, with mild epigastric tenderness.  Tenderness is also present in the right subcostal area.  There is no rebound or guarding. Extremities have trace edema, full range of motion is present. Skin is warm and dry without rash. Neurologic: Mental status is normal, cranial nerves are intact, moves all extremities equally.  (all labs ordered are listed, but only abnormal results are displayed) Labs Reviewed  COMPREHENSIVE METABOLIC PANEL WITH GFR - Abnormal; Notable for the following components:  Result Value   Potassium 3.4 (*)    Glucose, Bld 173 (*)    ALT 64 (*)    All other components within normal limits  CBC WITH DIFFERENTIAL/PLATELET - Abnormal; Notable for the following components:   WBC 13.2 (*)    Neutro Abs 10.9 (*)    All other components within normal limits  LIPASE, BLOOD  TROPONIN T, HIGH SENSITIVITY  TROPONIN T, HIGH SENSITIVITY    EKG: EKG Interpretation Date/Time:  Monday October 03 2024 02:04:46 EST Ventricular Rate:  66 PR Interval:  157 QRS Duration:  151 QT Interval:  438 QTC Calculation: 459 R Axis:   36  Text  Interpretation: Sinus rhythm Atrial premature complex Right bundle branch block When compared with ECG of 09/30/2024, Premature atrial complexes are now present Confirmed by Raford Lenis ([id number]) on 10/03/2024 2:09:23 AM  Radiology: US  Abdomen Limited Result Date: 10/03/2024 EXAM: Right Upper Quadrant Abdominal Ultrasound 10/03/2024 05:10:58 AM TECHNIQUE: Real-time ultrasonography of the right upper quadrant of the abdomen was performed. COMPARISON: None available. CLINICAL HISTORY: 59 year old male with right upper quadrant pain. FINDINGS: LIVER: Normal echogenicity. No intrahepatic biliary ductal dilatation. No evidence of mass. Hepatopetal flow in the portal vein. BILIARY SYSTEM: Gallbladder wall measures 2 mm, normal. Evidence of echogenic layering sludge or stones in the neck of the gallbladder on images 4 and 5, but mostly non-shadowing. No pericholecystic fluid. No sonographic Beverley sign is elicited. Common bile duct (CBD) size ranges from 5 to 7 mm, at the upper limits of normal. No ductal filling defect identified. OTHER: No right upper quadrant ascites. Right kidney is partially visible, negative. Limitations: Suboptimal visualization of the gallbladder and liver hilum due to overlying bowel gas. IMPRESSION: 1. Echogenic layering sludge vs stones in the gallbladder. No strong evidence of acute cholecystitis. 2. Common bile duct caliber at the upper limits of normal (5-7 mm). Electronically signed by: Helayne Hurst MD 10/03/2024 05:18 AM EST RP Workstation: [id number]   DG Chest Port 1 View Result Date: 10/03/2024 EXAM: 1 VIEW(S) XRAY OF THE CHEST 10/03/2024 02:37:11 AM COMPARISON: Chest x ray 09/30/2024. CLINICAL HISTORY: Chest pain. FINDINGS: LUNGS AND PLEURA: No focal pulmonary opacity. No pleural effusion. No pneumothorax. HEART AND MEDIASTINUM: No acute abnormality of the cardiac and mediastinal silhouettes. BONES AND SOFT TISSUES: No acute osseous abnormality. IMPRESSION: 1. No acute cardiopulmonary  abnormality. Electronically signed by: Glendia Molt MD 10/03/2024 02:59 AM EST RP Workstation: [id number]    Cardiac monitor shows normal sinus rhythm, per my interpretation.  Procedures   Medications Ordered in the ED  pantoprazole  (PROTONIX ) injection 40 mg (has no administration in time range)  alum & mag hydroxide-simeth (MAALOX/MYLANTA) 200-200-20 MG/5ML suspension 30 mL (has no administration in time range)                                    Medical Decision Making Amount and/or Complexity of Data Reviewed Labs: ordered. Radiology: ordered.  Risk OTC drugs. Prescription drug management.   Chest and upper abdominal pain with recent negative cardiac workup.  This is a presentation with wide range of treatment options and carries with it a high risk of avidity and complications.  Differential diagnosis includes, but is not limited to, ACS, GERD, esophageal spasm, peptic ulcer disease, cholecystitis, pancreatitis, diverticulitis.  I have reviewed his electrocardiogram and my interpretation is a sinus rhythm with occasional PAC, right bundle branch block with only change from prior  ECG being presence of PAC.  I reviewed his past records and do note ED visit on 09/30/2024 at which time labs were significant only for elevated random glucose level.  Troponin x 2 was negative and chest x-ray was normal.  He was given an ambulatory referral to cardiology.  I strongly suspect GI origin of pain, possible GERD, possible biliary colic although absence of nausea would argue against biliary colic.  I have ordered laboratory workup including repeat troponin x 2 but also including hepatic function studies and lipase.  I am also trying a therapeutic trial of intravenous pantoprazole  and oral antacid.  He had no relief with above-noted treatment.  I did order morphine  which did give him good relief of pain.  I have reviewed his laboratory tests, my interpretation is normal troponin x 2, borderline  hypokalemia, elevated random glucose, mildly elevated ALT of questionable clinical significance, normal lipase, mild leukocytosis which is nonspecific.  Chest x-ray shows no acute process.  Right upper quadrant ultrasound does show sludge in the gallbladder without findings of cholecystitis.  I am giving him referral to general surgery and gastroenterology, discharging him with prescriptions for pantoprazole , ondansetron  oral dissolving tablet, oxycodone .  I have ordered a dose of oral potassium and I am giving him a prescription for potassium to take at home.  He is to return if symptoms or not being adequately controlled at home.     Final diagnoses:  Upper abdominal pain  Hypokalemia  Elevated random blood glucose level  Elevated ALT measurement  Gallbladder sludge    ED Discharge Orders          Ordered    ondansetron  (ZOFRAN -ODT) 4 MG disintegrating tablet  Every 8 hours PRN        10/03/24 0633    potassium chloride  SA (KLOR-CON  M) 20 MEQ tablet  2 times daily        10/03/24 0633    pantoprazole  (PROTONIX ) 40 MG tablet  Daily        10/03/24 0633    oxyCODONE  (ROXICODONE ) 5 MG immediate release tablet  Every 4 hours PRN        10/03/24 9366               Raford Lenis, MD 10/03/24 [phone]  "

## 2024-10-03 NOTE — ED Triage Notes (Signed)
 Patient arrives via [facility] from home for centralized chest pain that started tonight described as pressure. Seen on Friday for same. EMS reports hypotensive episode with BP of 90/60 after nitroglycerin administration which resolved after 100ml of NS.

## 2024-10-03 NOTE — H&P (Signed)
 "    Shawn Glover Mar 29, 1966  [id number].    Requesting MD: Dr. Curtistine Dawn Chief Complaint/Reason for Consult: gallbladder sludge  HPI:  This is a pleasant 59 yo white male with a  history of HTN, GERD, HLD, and IBS who began having substernal chest pain on Friday.  He presented to the ED where he underwent a cardiac work up that was negative.  He was discharged back home.  He states Saturday and Sunday his pain was around a 1, but then last night after 7pm, this pain began to return.  He describes this as a pressure extending from his chest all the way down to his genitals.  He denies any N/V/D, fevers, dysuria.  He states that he has been drinking water still and some oral intake.  He states that this pain has nothing to do with food.  He has never had pain like this before.  He does take a GLP-1 that he started in July.  He has lost over 80lbs with this.  He is supposed to take this once a week, but due to the rapid weight loss, he only takes it every 2-3 weeks now.  His last dose was about 2 weeks ago.  He has never had symptoms like this before.  Here in the ED today, his WBC is mildly elevated at 13K, AST 64, other LFTs normal, lipase is normal, trops negative.  He had a plain CXR today that was negative and an abdominal US  that reveals layering sludge vs stones but no wall thickening, pericholecystic fluid, etc to suggest evidence of cholecystitis.  No other imaging completed. He is hypertensive and tachy in the low 100s, still with pain.  We are asked to see due to presence of sludge vs stones.  ROS: ROS: see HPI  History reviewed. No pertinent family history.  Past Medical History:  Diagnosis Date   Arthritis    GERD (gastroesophageal reflux disease)    Hyperlipemia    Hypertension    IBS (irritable bowel syndrome)    Primary localized osteoarthritis of left knee 07/24/2016    Past Surgical History:  Procedure Laterality Date   KNEE ARTHROSCOPY     MENISCECTOMY  2012    PARTIAL KNEE ARTHROPLASTY Left 07/24/2016   Procedure: LEFT UNICOMPARTMENTAL KNEE;  Surgeon: Fonda Olmsted, MD;  Location: [facility];  Service: Orthopedics;  Laterality: Left;    Social History:  reports that he has quit smoking. He has never used smokeless tobacco. He reports that he does not drink alcohol and does not use drugs.  Allergies: [Allergies]  [Allergies] No Known Allergies   (Not in a hospital admission)    Physical Exam: Blood pressure (!) 165/92, pulse (!) 106, temperature 98 F (36.7 C), resp. rate (!) 25, height 5' 7 (1.702 m), weight 104.6 kg, SpO2 100%. General: pleasant, WD, WN white male who is laying in bed and mildly sleepy due to pain meds, but appears to not feel well, but in no acute distress HEENT: head is normocephalic, atraumatic.  Sclera are noninjected.  PERRL.  Ears and nose without any masses or lesions.  Mouth is pink and dry Heart: regular rhythm, but mildly tachy in low 100s.  Normal s1,s2. No obvious murmurs, gallops, or rubs noted.  Palpable radial and pedal pulses bilaterally Lungs: CTAB, no wheezes, rhonchi, or rales noted.  Respiratory effort nonlabored on RA Abd: soft, some diffuse right-sided abdominal tenderness, ND, +BS, no masses, hernias, or organomegaly MS: all 4  extremities are symmetrical with no cyanosis, clubbing, or edema. Skin: warm and dry with no masses, lesions, or rashes Psych: A&Ox3 with an appropriate affect.   Results for orders placed or performed during the hospital encounter of 10/03/24 (from the past 48 hours)  Comprehensive metabolic panel     Status: Abnormal   Collection Time: 10/03/24  2:17 AM  Result Value Ref Range   Sodium 137 135 - 145 mmol/L   Potassium 3.4 (L) 3.5 - 5.1 mmol/L   Chloride 98 98 - 111 mmol/L   CO2 26 22 - 32 mmol/L   Glucose, Bld 173 (H) 70 - 99 mg/dL    Comment: Glucose reference range applies only to samples taken after fasting for at least 8 hours.   BUN 14 6 - 20 mg/dL    Creatinine, Ser 8.90 0.61 - 1.24 mg/dL   Calcium 9.1 8.9 - 89.6 mg/dL   Total Protein 6.8 6.5 - 8.1 g/dL   Albumin 4.1 3.5 - 5.0 g/dL   AST 36 15 - 41 U/L    Comment: HEMOLYSIS AT THIS LEVEL MAY AFFECT RESULT   ALT 64 (H) 0 - 44 U/L   Alkaline Phosphatase 74 38 - 126 U/L   Total Bilirubin 0.6 0.0 - 1.2 mg/dL   GFR, Estimated >39 >39 mL/min    Comment: (NOTE) Calculated using the CKD-EPI Creatinine Equation (2021)    Anion gap 13 5 - 15    Comment: Performed at [facility], [address] [place], [place] [address]  Lipase, blood     Status: None   Collection Time: 10/03/24  2:17 AM  Result Value Ref Range   Lipase 22 11 - 51 U/L    Comment: Performed at [facility], [address] [place], [place] [address]  CBC with Differential     Status: Abnormal   Collection Time: 10/03/24  2:17 AM  Result Value Ref Range   WBC 13.2 (H) 4.0 - 10.5 K/uL   RBC 4.82 4.22 - 5.81 MIL/uL   Hemoglobin 14.6 13.0 - 17.0 g/dL   HCT 58.6 60.9 - 47.9 %   MCV 85.7 80.0 - 100.0 fL   MCH 30.3 26.0 - 34.0 pg   MCHC 35.4 30.0 - 36.0 g/dL   RDW 87.0 88.4 - 84.4 %   Platelets 220 150 - 400 K/uL   nRBC 0.0 0.0 - 0.2 %   Neutrophils Relative % 82 %   Neutro Abs 10.9 (H) 1.7 - 7.7 K/uL   Lymphocytes Relative 10 %   Lymphs Abs 1.3 0.7 - 4.0 K/uL   Monocytes Relative 7 %   Monocytes Absolute 0.9 0.1 - 1.0 K/uL   Eosinophils Relative 1 %   Eosinophils Absolute 0.1 0.0 - 0.5 K/uL   Basophils Relative 0 %   Basophils Absolute 0.1 0.0 - 0.1 K/uL   Immature Granulocytes 0 %   Abs Immature Granulocytes 0.05 0.00 - 0.07 K/uL    Comment: Performed at [facility], [address] [place], [place] [address]  Troponin T, High Sensitivity     Status: None   Collection Time: 10/03/24  2:17 AM  Result Value Ref Range   Troponin T High Sensitivity <6 0 - 19 ng/L    Comment: (NOTE) Biotin concentrations > 1000 ng/mL falsely decrease TnT results.  Serial cardiac troponin measurements are  suggested.  Refer to the Links section for chest pain algorithms and additional  guidance. Performed at [facility], [address] [place], [place] [address]   Troponin T, High Sensitivity     Status: None   Collection Time: 10/03/24  4:04 AM  Result Value Ref Range   Troponin T High Sensitivity <6 0 - 19 ng/L    Comment: (NOTE) Biotin concentrations > 1000 ng/mL falsely decrease TnT results.  Serial cardiac troponin measurements are suggested.  Refer to the Links section for chest pain algorithms and additional  guidance. Performed at [facility], [address] [place], [place] [address]    US  Abdomen Limited Result Date: 10/03/2024 EXAM: Right Upper Quadrant Abdominal Ultrasound 10/03/2024 05:10:58 AM TECHNIQUE: Real-time ultrasonography of the right upper quadrant of the abdomen was performed. COMPARISON: None available. CLINICAL HISTORY: 59 year old male with right upper quadrant pain. FINDINGS: LIVER: Normal echogenicity. No intrahepatic biliary ductal dilatation. No evidence of mass. Hepatopetal flow in the portal vein. BILIARY SYSTEM: Gallbladder wall measures 2 mm, normal. Evidence of echogenic layering sludge or stones in the neck of the gallbladder on images 4 and 5, but mostly non-shadowing. No pericholecystic fluid. No sonographic Beverley sign is elicited. Common bile duct (CBD) size ranges from 5 to 7 mm, at the upper limits of normal. No ductal filling defect identified. OTHER: No right upper quadrant ascites. Right kidney is partially visible, negative. Limitations: Suboptimal visualization of the gallbladder and liver hilum due to overlying bowel gas. IMPRESSION: 1. Echogenic layering sludge vs stones in the gallbladder. No strong evidence of acute cholecystitis. 2. Common bile duct caliber at the upper limits of normal (5-7 mm). Electronically signed by: Helayne Hurst MD 10/03/2024 05:18 AM EST RP Workstation: [id number]   DG Chest Port 1 View Result Date:  10/03/2024 EXAM: 1 VIEW(S) XRAY OF THE CHEST 10/03/2024 02:37:11 AM COMPARISON: Chest x ray 09/30/2024. CLINICAL HISTORY: Chest pain. FINDINGS: LUNGS AND PLEURA: No focal pulmonary opacity. No pleural effusion. No pneumothorax. HEART AND MEDIASTINUM: No acute abnormality of the cardiac and mediastinal silhouettes. BONES AND SOFT TISSUES: No acute osseous abnormality. IMPRESSION: 1. No acute cardiopulmonary abnormality. Electronically signed by: Glendia Molt MD 10/03/2024 02:59 AM EST RP Workstation: [id number]      Assessment/Plan Abdominal pain, biliary sludge vs stones The patient has been seen, examined, labs, vitals, chart, and imaging personally reviewed.  The patient is having a pressure type pain from his chest down to his genitals.  This is not associated with oral intake.  He has no nausea or vomiting.  This started substernal.  He has negative troponins, CXR, and EKG with PACs which are new, RBBB, but no STEMI noted.  He does have sludge vs stones noted on his abdominal US , but no other findings of cholecystitis.  His story is not typical for biliary disease.  Given his history of HTN and his story, I think it would be prudent to CT scan him to rule out some other etiology for his symptoms before consideration of lap chole for sludge/stones noted.  I have d/w the patient and his wife as well as the EDP.  A CTA has been ordered.  We will follow up on this imaging for further surgical recommendations at that time.   FEN - NPO VTE - pending dispo ID - none currently Admit - obs  HTN HLD GERD IBS H/o obesity on GLP-1  I reviewed ED provider notes, last 24 h vitals and pain scores, last 48 h intake and output, last 24 h labs and trends, and last 24 h imaging results.  Burnard FORBES Banter,  PA-C [facility] 10/03/2024, 9:21 AM Please see Amion for pager number during day hours 7:00am-4:30pm or 7:00am -11:30am on weekends "

## 2024-10-03 NOTE — Op Note (Addendum)
 Preoperative diagnosis: Acute cholecystitis Postoperative diagnosis: Gangrenous cholecystitis Procedure: Laparoscopic cholecystectomy Surgeon: Dr. Adina Bury Anesthesia: General Estimated blood loss: 100 cc Specimens: Gallbladder and contents to pathology Drains: None Complications: None Sponge needle count was correct completion Disposition recovery stable condition   Indications: 63 yom with cholecystitis. We discussed lap chole.    Procedure: After informed consent was obtained he was taken to the operating room.  He was given antibiotics.  SCDs were in place.  He was placed under general anesthesia without complication.  He is prepped and draped in standard sterile surgical fashion.  Surgical timeout was then performed   I filtration Marcaine  below the umbilicus.  I made a vertical incision and grasped the fascia.  I incised the fascia sharply and entered the peritoneum bluntly without injury.  I then placed a 0 Vicryl pursestring suture through the fascia.  I inserted a Hassan trocar and insufflated the abdomen to 15 mmHg pressure.  I then inserted 3 additional 5 mm trocars in the epigastrium and right side of the abdomen without injury.  I was unable to visualize the gallbladder at first.    I had to use a combination of blunt dissection and cautery to remove the omentum from the gallbladder as well as the liver.  Eventually I was able to remove this from the gallbladder.  The gallbladder was necrotic.   With a lot of difficulty I then was able to dissect what I believed to be the cystic duct.  I did confirm this was correct with ICG dye.  However I was still not comfortable placing clips and removing the gallbladder at that point.  I elected to take this top-down.  I made a flap and I actually entered into the gallbladder superiorly as the back wall was necrotic.  There was no way to get the back wall off the liver without significant blood loss. Stones were spilled.  The stones were later  placed in the retrieval bag and removed so there were no stones retained.  Eventually I was able to take this all the way down to the cystic duct.  I was able to clip the cystic duct and leave three in place. These clips did traverse the duct. I then clipped the artery.  The gallbladder was then placed in a retrieval bag along with the stones and removed from the abdomen.  I then obtained hemostasis.  I did cauterize the back wall of the gallbladder on the liver. There were numerous areas on the liver that were bleeding.  I placed 2 pieces of Surgicel snow in the gallbladder fossa.  I then removed my Hassan trocar and tied my pursestring down. I placed an additional 0 vicryl suture using the suture passer device  This completely obliterated the defect.  The trocars were then removed and the abdomen desufflated.  These were closed with 4-0 Monocryl and glue.  He tolerated this well was transferred recovery stable.

## 2024-10-03 NOTE — Discharge Instructions (Addendum)
 [facility] LAPAROSCOPIC SURGERY: POST OP INSTRUCTIONS  Always review your discharge instruction sheet given to you by the facility where your surgery was performed. IF YOU HAVE DISABILITY OR FAMILY LEAVE FORMS, YOU MUST BRING THEM TO THE OFFICE FOR PROCESSING.   DO NOT GIVE THEM TO YOUR DOCTOR.  A prescription for pain medication may be given to you upon discharge.  Take your pain medication as prescribed, if needed.  If narcotic pain medicine is not needed, then you may take acetaminophen  (Tylenol ), naprosyn (Alleve), or ibuprofen  (Advil ) as needed. Take your usually prescribed medications unless otherwise directed. If you need a refill on your pain medication, please contact your pharmacy.  They will contact our office to request authorization. Prescriptions will not be filled after 5pm or on week-ends. You should follow a light diet the first few days after arrival home, such as soup and crackers, etc.  Be sure to include lots of fluids daily. Most patients will experience some swelling and bruising in the area of the incisions.  Ice packs will help.  Swelling and bruising can take several days to resolve.  It is common to experience some constipation if taking pain medication after surgery.  Increasing fluid intake and taking a stool softener (such as Colace) will usually help or prevent this problem from occurring.  A mild laxative (Milk of Magnesia or Miralax) should be taken according to package instructions if there are no bowel movements after 48 hours. Unless discharge instructions indicate otherwise, you may remove your bandages 48 hours after surgery, and you may shower at that time.  You may have steri-strips (small skin tapes) in place directly over the incision.  These strips should be left on the skin for 7-10 days.  If your surgeon used skin glue on the incision, you may shower in 24 hours.  The glue will flake off over the next  2-3 weeks.  Any sutures or staples will be removed at the office during your follow-up visit. ACTIVITIES:  You may resume regular (light) daily activities beginning the next day--such as daily self-care, walking, climbing stairs--gradually increasing activities as tolerated.  You may have sexual intercourse when it is comfortable.  Refrain from any heavy lifting or straining until approved by your doctor. You may drive when you are no longer taking prescription pain medication, you can comfortably wear a seatbelt, and you can safely maneuver your car and apply brakes. RETURN TO WORK:  __________________________________________________________ Rosine should see your doctor in the office for a follow-up appointment approximately 2-3 weeks after your surgery.  Make sure that you call for this appointment within a day or two after you arrive home to insure a convenient appointment time. OTHER INSTRUCTIONS: __________________________________________________________________________________________________________________________ __________________________________________________________________________________________________________________________ WHEN TO CALL YOUR DOCTOR: Fever over 101.0 Inability to urinate Continued bleeding from incision. Increased pain, redness, or drainage from the incision. Increasing abdominal pain  The clinic staff is available to answer your questions during regular business hours.  Please don't hesitate to call and ask to speak to one of the nurses for clinical concerns.  If you have a medical emergency, go to the nearest emergency room or call 911.  A surgeon from [facility] is always on call at the hospital. [address], [address], [place], [place]  [address] ? [address] [address], [place], [place]   [address] [phone] ? [phone] ? FAX [phone] Web site: [email or web address]

## 2024-10-03 NOTE — Anesthesia Procedure Notes (Signed)
 Procedure Name: Intubation Date/Time: 10/03/2024 3:56 PM  Performed by: Mannie Krystal LABOR, CRNAPre-anesthesia Checklist: Patient identified, Emergency Drugs available, Suction available and Patient being monitored Patient Re-evaluated:Patient Re-evaluated prior to induction Oxygen Delivery Method: Circle system utilized Preoxygenation: Pre-oxygenation with 100% oxygen Induction Type: IV induction Ventilation: Mask ventilation without difficulty Laryngoscope Size: Mac and 4 Grade View: Grade I Tube type: Oral Tube size: 7.5 mm Number of attempts: 1 Airway Equipment and Method: Stylet and Oral airway Placement Confirmation: ETT inserted through vocal cords under direct vision, positive ETCO2 and breath sounds checked- equal and bilateral Secured at: 23 cm Tube secured with: Tape Dental Injury: Teeth and Oropharynx as per pre-operative assessment

## 2024-10-03 NOTE — ED Notes (Addendum)
 Secure chat sent to provider Dr.Allen per request from pt and his mother to reevaluate decision to send him home. Pt appears uncomfortable in pain and tachycardic. He reports 8/10 pain in the abd and mid sternal area. Pt received Morphine  around 0600 hrs. He got little relief after administration but the pain is at 8/10. He denies any nausea at this time.

## 2024-10-03 NOTE — ED Provider Notes (Signed)
 Patient seen by Dr. Raford earlier today for abdominal discomfort.  Patient was pending for discharge.  Due to his severe pain family felt uncomfortable going home.  Chart reviewed.  Patient does have abdominal pain at this time.  Will consult general surgery and give pain medication   Dasie Faden, MD 10/03/24 [phone]

## 2024-10-03 NOTE — ED Notes (Signed)
 Made family and pt aware that the provider is busy with a sick pt and will come address their concerns at the earliest. Pt verbalized understanding.

## 2024-10-03 NOTE — Anesthesia Postprocedure Evaluation (Signed)
"   Anesthesia Post Note  Patient: Hillard A Roorda  Procedure(s) Performed: LAPAROSCOPIC CHOLECYSTECTOMY INDOCYANINE GREEN  FLUORESCENCE IMAGING (ICG)     Patient location during evaluation: PACU Anesthesia Type: General Level of consciousness: awake and alert and oriented Pain management: pain level controlled Vital Signs Assessment: post-procedure vital signs reviewed and stable Respiratory status: spontaneous breathing, nonlabored ventilation and respiratory function stable Cardiovascular status: blood pressure returned to baseline and stable Postop Assessment: no apparent nausea or vomiting Anesthetic complications: no   No notable events documented.  Last Vitals:  Vitals:   10/03/24 1715 10/03/24 1730  BP: 107/70 136/81  Pulse: 90 97  Resp: (!) 22 20  Temp: 36.8 C   SpO2: 98% 93%    Last Pain:  Vitals:   10/03/24 1730  TempSrc:   PainSc: 0-No pain   Pain Goal: Patients Stated Pain Goal: 3 (10/03/24 1350)                 Shirell Struthers A.      "

## 2024-10-03 NOTE — ED Notes (Signed)
 Pt ambulated to the bathroom on RA with a steady gait

## 2024-10-03 NOTE — Transfer of Care (Signed)
 Immediate Anesthesia Transfer of Care Note  Patient: Shawn Glover  Procedure(s) Performed: LAPAROSCOPIC CHOLECYSTECTOMY INDOCYANINE GREEN  FLUORESCENCE IMAGING (ICG)  Patient Location: PACU  Anesthesia Type:General  Level of Consciousness: awake, alert , oriented, and patient cooperative  Airway & Oxygen Therapy: Patient Spontanous Breathing and Patient connected to face mask oxygen  Post-op Assessment: Report given to RN, Post -op Vital signs reviewed and stable, Patient moving all extremities, and Patient moving all extremities X 4  Post vital signs: Reviewed and stable  Last Vitals:  Vitals Value Taken Time  BP 107/70 10/03/24 17:15  Temp    Pulse 90 10/03/24 17:19  Resp 21 10/03/24 17:19  SpO2 98 % 10/03/24 17:19  Vitals shown include unfiled device data.  Last Pain:  Vitals:   10/03/24 1350  TempSrc:   PainSc: 4       Patients Stated Pain Goal: 3 (10/03/24 1350)  Complications: No notable events documented.

## 2024-10-03 NOTE — Consult Note (Signed)
 "    Shawn Glover May 08, 1966  [id number].    Requesting MD: Dr. Curtistine Dawn Chief Complaint/Reason for Consult: gallbladder sludge  HPI:  This is a pleasant 58 yo white male with a  history of HTN, GERD, HLD, and IBS who began having substernal chest pain on Friday.  He presented to the ED where he underwent a cardiac work up that was negative.  He was discharged back home.  He states Saturday and Sunday his pain was around a 1, but then last night after 7pm, this pain began to return.  He describes this as a pressure extending from his chest all the way down to his genitals.  He denies any N/V/D, fevers, dysuria.  He states that he has been drinking water still and some oral intake.  He states that this pain has nothing to do with food.  He has never had pain like this before.  He does take a GLP-1 that he started in July.  He has lost over 80lbs with this.  He is supposed to take this once a week, but due to the rapid weight loss, he only takes it every 2-3 weeks now.  His last dose was about 2 weeks ago.  He has never had symptoms like this before.  Here in the ED today, his WBC is mildly elevated at 13K, AST 64, other LFTs normal, lipase is normal, trops negative.  He had a plain CXR today that was negative and an abdominal US  that reveals layering sludge vs stones but no wall thickening, pericholecystic fluid, etc to suggest evidence of cholecystitis.  No other imaging completed. He is hypertensive and tachy in the low 100s, still with pain.  We are asked to see due to presence of sludge vs stones.  ROS: ROS: see HPI  History reviewed. No pertinent family history.  Past Medical History:  Diagnosis Date   Arthritis    GERD (gastroesophageal reflux disease)    Hyperlipemia    Hypertension    IBS (irritable bowel syndrome)    Primary localized osteoarthritis of left knee 07/24/2016    Past Surgical History:  Procedure Laterality Date   KNEE ARTHROSCOPY     MENISCECTOMY  2012    PARTIAL KNEE ARTHROPLASTY Left 07/24/2016   Procedure: LEFT UNICOMPARTMENTAL KNEE;  Surgeon: Fonda Olmsted, MD;  Location: [facility];  Service: Orthopedics;  Laterality: Left;    Social History:  reports that he has quit smoking. He has never used smokeless tobacco. He reports that he does not drink alcohol and does not use drugs.  Allergies: Allergies[1]  (Not in a hospital admission)    Physical Exam: Blood pressure (!) 165/92, pulse (!) 106, temperature 98 F (36.7 C), resp. rate (!) 25, height 5' 7 (1.702 m), weight 104.6 kg, SpO2 100%. General: pleasant, WD, WN white male who is laying in bed and mildly sleepy due to pain meds, but appears to not feel well, but in no acute distress HEENT: head is normocephalic, atraumatic.  Sclera are noninjected.  PERRL.  Ears and nose without any masses or lesions.  Mouth is pink and dry Heart: regular rhythm, but mildly tachy in low 100s.  Normal s1,s2. No obvious murmurs, gallops, or rubs noted.  Palpable radial and pedal pulses bilaterally Lungs: CTAB, no wheezes, rhonchi, or rales noted.  Respiratory effort nonlabored on RA Abd: soft, some diffuse right-sided abdominal tenderness, ND, +BS, no masses, hernias, or organomegaly MS: all 4 extremities are symmetrical with no cyanosis,  clubbing, or edema. Skin: warm and dry with no masses, lesions, or rashes Psych: A&Ox3 with an appropriate affect.   Results for orders placed or performed during the hospital encounter of 10/03/24 (from the past 48 hours)  Comprehensive metabolic panel     Status: Abnormal   Collection Time: 10/03/24  2:17 AM  Result Value Ref Range   Sodium 137 135 - 145 mmol/L   Potassium 3.4 (L) 3.5 - 5.1 mmol/L   Chloride 98 98 - 111 mmol/L   CO2 26 22 - 32 mmol/L   Glucose, Bld 173 (H) 70 - 99 mg/dL    Comment: Glucose reference range applies only to samples taken after fasting for at least 8 hours.   BUN 14 6 - 20 mg/dL   Creatinine, Ser 8.90 0.61 - 1.24  mg/dL   Calcium 9.1 8.9 - 89.6 mg/dL   Total Protein 6.8 6.5 - 8.1 g/dL   Albumin 4.1 3.5 - 5.0 g/dL   AST 36 15 - 41 U/L    Comment: HEMOLYSIS AT THIS LEVEL MAY AFFECT RESULT   ALT 64 (H) 0 - 44 U/L   Alkaline Phosphatase 74 38 - 126 U/L   Total Bilirubin 0.6 0.0 - 1.2 mg/dL   GFR, Estimated >39 >39 mL/min    Comment: (NOTE) Calculated using the CKD-EPI Creatinine Equation (2021)    Anion gap 13 5 - 15    Comment: Performed at [facility], [address], [place], [place] [address]  Lipase, blood     Status: None   Collection Time: 10/03/24  2:17 AM  Result Value Ref Range   Lipase 22 11 - 51 U/L    Comment: Performed at [facility], [address], [place], [place] [address]  CBC with Differential     Status: Abnormal   Collection Time: 10/03/24  2:17 AM  Result Value Ref Range   WBC 13.2 (H) 4.0 - 10.5 K/uL   RBC 4.82 4.22 - 5.81 MIL/uL   Hemoglobin 14.6 13.0 - 17.0 g/dL   HCT 58.6 60.9 - 47.9 %   MCV 85.7 80.0 - 100.0 fL   MCH 30.3 26.0 - 34.0 pg   MCHC 35.4 30.0 - 36.0 g/dL   RDW 87.0 88.4 - 84.4 %   Platelets 220 150 - 400 K/uL   nRBC 0.0 0.0 - 0.2 %   Neutrophils Relative % 82 %   Neutro Abs 10.9 (H) 1.7 - 7.7 K/uL   Lymphocytes Relative 10 %   Lymphs Abs 1.3 0.7 - 4.0 K/uL   Monocytes Relative 7 %   Monocytes Absolute 0.9 0.1 - 1.0 K/uL   Eosinophils Relative 1 %   Eosinophils Absolute 0.1 0.0 - 0.5 K/uL   Basophils Relative 0 %   Basophils Absolute 0.1 0.0 - 0.1 K/uL   Immature Granulocytes 0 %   Abs Immature Granulocytes 0.05 0.00 - 0.07 K/uL    Comment: Performed at [facility], [address] [place], [place] [address]  Troponin T, High Sensitivity     Status: None   Collection Time: 10/03/24  2:17 AM  Result Value Ref Range   Troponin T High Sensitivity <6 0 - 19 ng/L    Comment: (NOTE) Biotin concentrations > 1000 ng/mL falsely decrease TnT results.  Serial cardiac troponin measurements are suggested.  Refer to the Links  section for chest pain algorithms and additional  guidance. Performed at [facility], [address] [place],  [place] [address]   Troponin T, High Sensitivity     Status: None   Collection Time: 10/03/24  4:04 AM  Result Value Ref Range   Troponin T High Sensitivity <6 0 - 19 ng/L    Comment: (NOTE) Biotin concentrations > 1000 ng/mL falsely decrease TnT results.  Serial cardiac troponin measurements are suggested.  Refer to the Links section for chest pain algorithms and additional  guidance. Performed at [facility], [address], [place], [place] [address]    US  Abdomen Limited Result Date: 10/03/2024 EXAM: Right Upper Quadrant Abdominal Ultrasound 10/03/2024 05:10:58 AM TECHNIQUE: Real-time ultrasonography of the right upper quadrant of the abdomen was performed. COMPARISON: None available. CLINICAL HISTORY: 59 year old male with right upper quadrant pain. FINDINGS: LIVER: Normal echogenicity. No intrahepatic biliary ductal dilatation. No evidence of mass. Hepatopetal flow in the portal vein. BILIARY SYSTEM: Gallbladder wall measures 2 mm, normal. Evidence of echogenic layering sludge or stones in the neck of the gallbladder on images 4 and 5, but mostly non-shadowing. No pericholecystic fluid. No sonographic Beverley sign is elicited. Common bile duct (CBD) size ranges from 5 to 7 mm, at the upper limits of normal. No ductal filling defect identified. OTHER: No right upper quadrant ascites. Right kidney is partially visible, negative. Limitations: Suboptimal visualization of the gallbladder and liver hilum due to overlying bowel gas. IMPRESSION: 1. Echogenic layering sludge vs stones in the gallbladder. No strong evidence of acute cholecystitis. 2. Common bile duct caliber at the upper limits of normal (5-7 mm). Electronically signed by: Helayne Hurst MD 10/03/2024 05:18 AM EST RP Workstation: [id number]   DG Chest Port 1 View Result Date: 10/03/2024 EXAM: 1 VIEW(S) XRAY OF  THE CHEST 10/03/2024 02:37:11 AM COMPARISON: Chest x ray 09/30/2024. CLINICAL HISTORY: Chest pain. FINDINGS: LUNGS AND PLEURA: No focal pulmonary opacity. No pleural effusion. No pneumothorax. HEART AND MEDIASTINUM: No acute abnormality of the cardiac and mediastinal silhouettes. BONES AND SOFT TISSUES: No acute osseous abnormality. IMPRESSION: 1. No acute cardiopulmonary abnormality. Electronically signed by: Glendia Molt MD 10/03/2024 02:59 AM EST RP Workstation: [id number]      Assessment/Plan Abdominal pain, biliary sludge vs stones The patient has been seen, examined, labs, vitals, chart, and imaging personally reviewed.  The patient is having a pressure type pain from his chest down to his genitals.  This is not associated with oral intake.  He has no nausea or vomiting.  This started substernal.  He has negative troponins, CXR, and EKG with PACs which are new, RBBB, but no STEMI noted.  He does have sludge vs stones noted on his abdominal US , but no other findings of cholecystitis.  His story is not typical for biliary disease.  Given his history of HTN and his story, I think it would be prudent to CT scan him to rule out some other etiology for his symptoms before consideration of lap chole for sludge/stones noted.  I have d/w the patient and his wife as well as the EDP.  A CTA has been ordered.  We will follow up on this imaging for further surgical recommendations at that time.   FEN - NPO VTE - pending dispo ID - none currently Admit - pending  HTN HLD GERD IBS H/o obesity on GLP-1  I reviewed ED provider notes, last 24 h vitals and pain scores, last 48 h intake and output, last 24 h labs and trends, and last 24 h imaging results.  Burnard FORBES Banter, [facility] 10/03/2024, 9:21  AM Please see Amion for pager number during day hours 7:00am-4:30pm or 7:00am -11:30am on weekends      [1] No Known Allergies  "

## 2024-10-04 ENCOUNTER — Encounter (HOSPITAL_COMMUNITY): Payer: Self-pay | Admitting: General Surgery

## 2024-10-04 MED ORDER — OXYCODONE HCL 5 MG PO TABS: 5.0000 mg | ORAL_TABLET | ORAL | 0 refills | Status: AC | PRN

## 2024-10-04 NOTE — Progress Notes (Signed)
 Patient alert and oriented, voided, ambulated. Surgical sign clean and dry no sign of infection. D/c instructions explain and given to the patient. All questions answered.

## 2024-10-04 NOTE — Care Management (Signed)
 Patient with order to DC to home today. Unit staff to provide DME needed for home.   No HH needs identified Patient will have family/ friends provide transportation home. No other TOC needs identified for DC

## 2024-10-04 NOTE — Discharge Summary (Signed)
 Physician Discharge Summary  Patient ID: Shawn Glover MRN: [id number] DOB/AGE: 02-26-1966 58 y.o.  Admit date: 10/03/2024 Discharge date: 10/04/2024  Admission Diagnoses: Cholecystitis  Discharge Diagnoses:  Principal Problem:   Cholecystitis   Discharged Condition: good  Hospital Course: 52 yom admitted with gangrenous cholecystitis.  Underwent lap chole. Doing well the following morning-tolerating a diet, voiding, ambulating and pain controlled. Ready for dc  Consults: None  Significant Diagnostic Studies: us  and ct scan  Treatments: surgery: lap chole  Discharge Exam: Blood pressure 137/84, pulse 81, temperature 97.7 F (36.5 C), temperature source Oral, resp. rate 19, height 5' 7 (1.702 m), weight 98 kg, SpO2 95%. Incisions clean, ab approp tender  Disposition: Discharge disposition: 01-Home or Self Care        Allergies as of 10/04/2024   No Known Allergies      Medication List     STOP taking these medications    ondansetron  4 MG tablet Commonly known as: Zofran    rivaroxaban  10 MG Tabs tablet Commonly known as: Xarelto        TAKE these medications    amLODipine  10 MG tablet Commonly known as: NORVASC  Take 10 mg by mouth daily.   aspirin EC 81 MG tablet Take 81 mg by mouth daily. Swallow whole.   B COMPLEX PO Take 1 tablet by mouth daily.   CREATINE PO Take 1 Dose by mouth daily.   D3 PO Take 1 tablet by mouth daily.   MAG GLYCINATE PO Take 1 Dose by mouth daily.   MULTIVITAMIN PO Take 1 tablet by mouth daily.   ondansetron  4 MG disintegrating tablet Commonly known as: ZOFRAN -ODT Take 1 tablet (4 mg total) by mouth every 8 (eight) hours as needed for nausea or vomiting.   OVER THE COUNTER MEDICATION Take 1 Dose by mouth daily. Essential Amino Acids OTC   oxyCODONE  5 MG immediate release tablet Commonly known as: Roxicodone  Take 1-2 tablets (5-10 mg total) by mouth every 4 (four) hours as needed for severe pain (pain score  7-10). What changed: Another medication with the same name was added. Make sure you understand how and when to take each.   oxyCODONE  5 MG immediate release tablet Commonly known as: Oxy IR/ROXICODONE  Take 1 tablet (5 mg total) by mouth every 4 (four) hours as needed for moderate pain (pain score 4-6). What changed: You were already taking a medication with the same name, and this prescription was added. Make sure you understand how and when to take each.   pantoprazole  40 MG tablet Commonly known as: PROTONIX  Take 1 tablet (40 mg total) by mouth daily.   potassium chloride  SA 20 MEQ tablet Commonly known as: KLOR-CON  M Take 1 tablet (20 mEq total) by mouth 2 (two) times daily.   THEANINE PO Take 1 Dose by mouth daily.   TIRZEPATIDE  Inject 20 Units into the skin once a week.        Follow-up Information     Shona Norleen PEDLAR, MD .   Specialty: Internal Medicine Contact information: [address] [place] [address] [phone]         [facility]. Schedule an appointment as soon as possible for a visit .   Specialty: Gastroenterology Contact information: [address] [place]   [address] [phone]        Surgery, [place] .   Specialty: General Surgery Why: we will call with follow up appt Contact information: [address] [place] [address] [phone]                 Signed: Donnice Bury 10/04/2024, 2:30 PM

## 2024-10-06 LAB — SURGICAL PATHOLOGY
